# Patient Record
Sex: Male | Born: 1975 | Race: White | Hispanic: No | Marital: Single | Smoking: Current every day smoker
Health system: Southern US, Community
[De-identification: ages and names within clinical notes are randomized; demographics above are authoritative.]

## PROBLEM LIST (undated history)

## (undated) DIAGNOSIS — F1990 Other psychoactive substance use, unspecified, uncomplicated: Secondary | ICD-10-CM

## (undated) DIAGNOSIS — F988 Other specified behavioral and emotional disorders with onset usually occurring in childhood and adolescence: Secondary | ICD-10-CM

## (undated) DIAGNOSIS — F419 Anxiety disorder, unspecified: Secondary | ICD-10-CM

## (undated) DIAGNOSIS — B182 Chronic viral hepatitis C: Secondary | ICD-10-CM

## (undated) DIAGNOSIS — F1110 Opioid abuse, uncomplicated: Secondary | ICD-10-CM

## (undated) DIAGNOSIS — F32A Depression, unspecified: Secondary | ICD-10-CM

## (undated) HISTORY — PX: APPENDECTOMY: SHX54

## (undated) HISTORY — DX: Depression, unspecified: F32.A

## (undated) HISTORY — DX: Chronic viral hepatitis C: B18.2

## (undated) HISTORY — DX: Opioid abuse, uncomplicated: F11.10

## (undated) HISTORY — DX: Anxiety disorder, unspecified: F41.9

## (undated) HISTORY — DX: Other specified behavioral and emotional disorders with onset usually occurring in childhood and adolescence: F98.8

---

## 2003-10-11 ENCOUNTER — Encounter: Admission: RE | Admit: 2003-10-11 | Discharge: 2003-10-11 | Payer: Self-pay | Admitting: *Deleted

## 2004-05-11 ENCOUNTER — Ambulatory Visit (HOSPITAL_COMMUNITY): Payer: Self-pay | Admitting: Psychiatry

## 2004-08-15 ENCOUNTER — Ambulatory Visit (HOSPITAL_COMMUNITY): Payer: Self-pay | Admitting: Psychiatry

## 2004-09-07 ENCOUNTER — Emergency Department (HOSPITAL_COMMUNITY): Admission: EM | Admit: 2004-09-07 | Discharge: 2004-09-07 | Payer: Self-pay | Admitting: Family Medicine

## 2004-09-26 ENCOUNTER — Ambulatory Visit (HOSPITAL_COMMUNITY): Payer: Self-pay | Admitting: Psychiatry

## 2004-11-28 ENCOUNTER — Ambulatory Visit (HOSPITAL_COMMUNITY): Payer: Self-pay | Admitting: Psychiatry

## 2004-11-28 ENCOUNTER — Ambulatory Visit: Payer: Self-pay | Admitting: Psychiatry

## 2006-01-10 ENCOUNTER — Ambulatory Visit (HOSPITAL_COMMUNITY): Payer: Self-pay | Admitting: Psychiatry

## 2006-06-18 ENCOUNTER — Ambulatory Visit (HOSPITAL_COMMUNITY): Payer: Self-pay | Admitting: Psychiatry

## 2006-08-15 ENCOUNTER — Ambulatory Visit (HOSPITAL_COMMUNITY): Payer: Self-pay | Admitting: Psychiatry

## 2006-12-04 ENCOUNTER — Ambulatory Visit (HOSPITAL_COMMUNITY): Payer: Self-pay | Admitting: Psychiatry

## 2010-02-24 ENCOUNTER — Emergency Department (HOSPITAL_COMMUNITY): Admission: EM | Admit: 2010-02-24 | Discharge: 2010-02-25 | Payer: Self-pay | Admitting: Emergency Medicine

## 2010-07-15 ENCOUNTER — Emergency Department (HOSPITAL_COMMUNITY)
Admission: EM | Admit: 2010-07-15 | Discharge: 2010-07-16 | Payer: Self-pay | Source: Home / Self Care | Admitting: Emergency Medicine

## 2010-10-16 LAB — CBC
MCH: 33.9 pg (ref 26.0–34.0)
MCHC: 34.3 g/dL (ref 30.0–36.0)
MCV: 98.9 fL (ref 78.0–100.0)
Platelets: 215 10*3/uL (ref 150–400)
RDW: 14.3 % (ref 11.5–15.5)

## 2010-10-16 LAB — RAPID URINE DRUG SCREEN, HOSP PERFORMED
Barbiturates: NOT DETECTED
Opiates: NOT DETECTED

## 2010-10-16 LAB — DIFFERENTIAL
Basophils Relative: 0 % (ref 0–1)
Eosinophils Absolute: 0 10*3/uL (ref 0.0–0.7)
Eosinophils Relative: 0 % (ref 0–5)
Neutrophils Relative %: 75 % (ref 43–77)

## 2010-10-16 LAB — BASIC METABOLIC PANEL
BUN: 21 mg/dL (ref 6–23)
CO2: 28 mEq/L (ref 19–32)
Calcium: 9 mg/dL (ref 8.4–10.5)
Creatinine, Ser: 1.08 mg/dL (ref 0.4–1.5)
GFR calc Af Amer: >60 mL/min (ref >60)
Glucose, Bld: 99 mg/dL (ref 70–99)

## 2010-10-20 LAB — RAPID URINE DRUG SCREEN, HOSP PERFORMED
Benzodiazepines: NOT DETECTED
Cocaine: NOT DETECTED
Opiates: NOT DETECTED
Tetrahydrocannabinol: NOT DETECTED

## 2010-10-20 LAB — CBC
HCT: 43.4 % (ref 39.0–52.0)
Hemoglobin: 15 g/dL (ref 13.0–17.0)
MCH: 32.8 pg (ref 26.0–34.0)
MCHC: 34.7 g/dL (ref 30.0–36.0)
RDW: 14.3 % (ref 11.5–15.5)

## 2010-10-20 LAB — COMPREHENSIVE METABOLIC PANEL
ALT: 37 U/L (ref 0–53)
CO2: 24 mEq/L (ref 19–32)
Calcium: 9.3 mg/dL (ref 8.4–10.5)
Creatinine, Ser: 0.86 mg/dL (ref 0.4–1.5)
GFR calc Af Amer: >60 mL/min (ref >60)
GFR calc non Af Amer: >60 mL/min (ref >60)
Glucose, Bld: 94 mg/dL (ref 70–99)
Sodium: 144 mEq/L (ref 135–145)
Total Protein: 7.9 g/dL (ref 6.0–8.3)

## 2010-10-20 LAB — DIFFERENTIAL
Lymphocytes Relative: 20 % (ref 12–46)
Lymphs Abs: 1.8 10*3/uL (ref 0.7–4.0)
Monocytes Relative: 6 % (ref 3–12)
Neutrophils Relative %: 73 % (ref 43–77)

## 2010-10-20 LAB — ETHANOL: Alcohol, Ethyl (B): 161 mg/dL — ABNORMAL HIGH (ref 0–10)

## 2011-06-16 ENCOUNTER — Emergency Department (HOSPITAL_COMMUNITY)
Admission: EM | Admit: 2011-06-16 | Discharge: 2011-06-16 | Discharge status: Against Medical Advice | Payer: Self-pay | Attending: Emergency Medicine | Admitting: Emergency Medicine

## 2011-06-16 DIAGNOSIS — Z0389 Encounter for observation for other suspected diseases and conditions ruled out: Secondary | ICD-10-CM | POA: Insufficient documentation

## 2011-06-16 LAB — RAPID URINE DRUG SCREEN, HOSP PERFORMED
Amphetamines: NOT DETECTED
Barbiturates: NOT DETECTED
Benzodiazepines: NOT DETECTED
Cocaine: NOT DETECTED
Opiates: NOT DETECTED
Tetrahydrocannabinol: NOT DETECTED

## 2011-06-16 NOTE — ED Notes (Signed)
Checked on reason for lab results delay; Alycia Rossetti in lab states no blood in lab; Deidre states she walked the blood to the lab; no blood found; Joe AC to lab to speak with Alycia Rossetti; still no blood located; in to speak with patient and mother with Gabriel Rung; as soon as pt notified we needed to restick him the mother stood up and said they were not staying any longer; Gabriel Rung AC attempted to speak with family and patient and patient cursed saying he wasn't staying, mother stated this was ridiculous; pt given bag of belongings and left prior to signing AMA form; Joe attempted to continue to speak with pt and family but they walked out; Kyla Balzarine PA notified of patient leaving

## 2011-06-17 ENCOUNTER — Encounter (HOSPITAL_COMMUNITY): Payer: Self-pay | Admitting: *Deleted

## 2011-06-17 ENCOUNTER — Ambulatory Visit (HOSPITAL_COMMUNITY)
Admission: EM | Admit: 2011-06-17 | Discharge: 2011-06-17 | Disposition: A | Discharge status: Home / Self Care | Payer: PRIVATE HEALTH INSURANCE | Source: Home / Self Care | Attending: Psychiatry | Admitting: Psychiatry

## 2011-06-17 ENCOUNTER — Inpatient Hospital Stay (HOSPITAL_COMMUNITY)
Admission: AD | Admit: 2011-06-17 | Discharge: 2011-06-19 | DRG: 897 | Disposition: A | Discharge status: Home / Self Care | Payer: PRIVATE HEALTH INSURANCE | Source: Ambulatory Visit | Attending: Psychiatry | Admitting: Psychiatry

## 2011-06-17 ENCOUNTER — Emergency Department (HOSPITAL_COMMUNITY)
Admission: EM | Admit: 2011-06-17 | Discharge: 2011-06-17 | Disposition: A | Discharge status: Other Acute Inpatient Hospital | Payer: PRIVATE HEALTH INSURANCE | Source: Home / Self Care

## 2011-06-17 DIAGNOSIS — F1310 Sedative, hypnotic or anxiolytic abuse, uncomplicated: Secondary | ICD-10-CM | POA: Diagnosis present

## 2011-06-17 DIAGNOSIS — F1010 Alcohol abuse, uncomplicated: Principal | ICD-10-CM | POA: Diagnosis present

## 2011-06-17 DIAGNOSIS — F1020 Alcohol dependence, uncomplicated: Secondary | ICD-10-CM

## 2011-06-17 DIAGNOSIS — F1920 Other psychoactive substance dependence, uncomplicated: Secondary | ICD-10-CM | POA: Diagnosis present

## 2011-06-17 DIAGNOSIS — Z79899 Other long term (current) drug therapy: Secondary | ICD-10-CM

## 2011-06-17 DIAGNOSIS — Z560 Unemployment, unspecified: Secondary | ICD-10-CM

## 2011-06-17 LAB — DIFFERENTIAL
Basophils Absolute: 0 10*3/uL (ref 0.0–0.1)
Lymphocytes Relative: 35 % (ref 12–46)
Lymphs Abs: 3.5 10*3/uL (ref 0.7–4.0)
Monocytes Absolute: 0.5 10*3/uL (ref 0.1–1.0)
Neutro Abs: 5.8 10*3/uL (ref 1.7–7.7)

## 2011-06-17 LAB — RAPID URINE DRUG SCREEN, HOSP PERFORMED: Barbiturates: NOT DETECTED

## 2011-06-17 LAB — CBC
HCT: 46.6 % (ref 39.0–52.0)
Hemoglobin: 16.3 g/dL (ref 13.0–17.0)
MCV: 93 fL (ref 78.0–100.0)
RBC: 5.01 MIL/uL (ref 4.22–5.81)
RDW: 14.3 % (ref 11.5–15.5)
WBC: 10 10*3/uL (ref 4.0–10.5)

## 2011-06-17 LAB — BASIC METABOLIC PANEL
BUN: 14 mg/dL (ref 6–23)
CO2: 23 mEq/L (ref 19–32)
Chloride: 100 mEq/L (ref 96–112)
GFR calc Af Amer: >90 mL/min (ref >90)
Potassium: 3.6 mEq/L (ref 3.5–5.1)

## 2011-06-17 MED ORDER — CHLORDIAZEPOXIDE HCL 25 MG PO CAPS
25.0000 mg | ORAL_CAPSULE | Freq: Once | ORAL | Status: AC
  Administered 2011-06-17: 25 mg via ORAL

## 2011-06-17 MED ORDER — LORAZEPAM 1 MG PO TABS: 1.0000 mg | ORAL_TABLET | Freq: Once | ORAL | Status: DC

## 2011-06-17 MED ORDER — IBUPROFEN 200 MG PO TABS: 600.0000 mg | ORAL_TABLET | Freq: Three times a day (TID) | ORAL | Status: DC | PRN

## 2011-06-17 MED ORDER — CITALOPRAM HYDROBROMIDE 10 MG PO TABS: 10.0000 mg | ORAL_TABLET | Freq: Every day | ORAL | Status: DC

## 2011-06-17 MED ORDER — NICOTINE 21 MG/24HR TD PT24: 21.0000 mg | MEDICATED_PATCH | Freq: Every day | TRANSDERMAL | Status: DC

## 2011-06-17 MED ORDER — VENLAFAXINE HCL ER 37.5 MG PO CP24
37.5000 mg | ORAL_CAPSULE | Freq: Every day | ORAL | Status: DC
  Administered 2011-06-18 – 2011-06-19 (×2): 37.5 mg via ORAL
  Filled 2011-06-17 (×2): qty 1

## 2011-06-17 MED ORDER — CHLORPROMAZINE HCL 25 MG PO TABS
25.0000 mg | ORAL_TABLET | Freq: Three times a day (TID) | ORAL | Status: DC
  Administered 2011-06-17 – 2011-06-19 (×6): 25 mg via ORAL
  Filled 2011-06-17 (×7): qty 1

## 2011-06-17 MED ORDER — CHLORDIAZEPOXIDE HCL 25 MG PO CAPS: 25.0000 mg | ORAL_CAPSULE | ORAL | Status: DC

## 2011-06-17 MED ORDER — LORAZEPAM 1 MG PO TABS: 1.0000 mg | ORAL_TABLET | Freq: Three times a day (TID) | ORAL | Status: DC | PRN

## 2011-06-17 MED ORDER — HYDROXYZINE PAMOATE 25 MG PO CAPS: 25.0000 mg | ORAL_CAPSULE | Freq: Four times a day (QID) | ORAL | Status: DC | PRN

## 2011-06-17 MED ORDER — CHLORDIAZEPOXIDE HCL 25 MG PO CAPS: 25.0000 mg | ORAL_CAPSULE | Freq: Three times a day (TID) | ORAL | Status: DC

## 2011-06-17 MED ORDER — ACETAMINOPHEN 325 MG PO TABS: 650.0000 mg | ORAL_TABLET | ORAL | Status: DC | PRN

## 2011-06-17 MED ORDER — LOPERAMIDE HCL 2 MG PO CAPS: 2.0000 mg | ORAL_CAPSULE | ORAL | Status: DC | PRN

## 2011-06-17 MED ORDER — THERA M PLUS PO TABS
1.0000 | ORAL_TABLET | Freq: Every day | ORAL | Status: DC
  Administered 2011-06-17: 1 via ORAL
  Administered 2011-06-18: 08:00:00 via ORAL
  Administered 2011-06-19: 1 via ORAL
  Filled 2011-06-17 (×2): qty 1

## 2011-06-17 MED ORDER — THERA M PLUS PO TABS
1.0000 | ORAL_TABLET | Freq: Every day | ORAL | Status: DC
  Administered 2011-06-17: 1 via ORAL

## 2011-06-17 MED ORDER — CHLORDIAZEPOXIDE HCL 25 MG PO CAPS
25.0000 mg | ORAL_CAPSULE | Freq: Four times a day (QID) | ORAL | Status: DC
  Administered 2011-06-17: 25 mg via ORAL
  Filled 2011-06-17: qty 1

## 2011-06-17 MED ORDER — ONDANSETRON 4 MG PO TBDP: 4.0000 mg | ORAL_TABLET | Freq: Four times a day (QID) | ORAL | Status: DC | PRN

## 2011-06-17 MED ORDER — CHLORDIAZEPOXIDE HCL 25 MG PO CAPS: 25.0000 mg | ORAL_CAPSULE | Freq: Every day | ORAL | Status: DC

## 2011-06-17 MED ORDER — ZOLPIDEM TARTRATE 5 MG PO TABS: 5.0000 mg | ORAL_TABLET | Freq: Every evening | ORAL | Status: DC | PRN

## 2011-06-17 MED ORDER — CHLORDIAZEPOXIDE HCL 25 MG PO CAPS
ORAL_CAPSULE | ORAL | Status: AC
  Filled 2011-06-17: qty 1

## 2011-06-17 MED ORDER — VITAMIN B-1 100 MG PO TABS
100.0000 mg | ORAL_TABLET | Freq: Every day | ORAL | Status: DC
  Administered 2011-06-18 – 2011-06-19 (×2): 100 mg via ORAL
  Filled 2011-06-17 (×3): qty 1

## 2011-06-17 MED ORDER — NICOTINE 21 MG/24HR TD PT24
21.0000 mg | MEDICATED_PATCH | Freq: Every day | TRANSDERMAL | Status: DC
  Administered 2011-06-17 – 2011-06-18 (×2): 21 mg via TRANSDERMAL
  Filled 2011-06-17 (×3): qty 1

## 2011-06-17 MED ORDER — CHLORDIAZEPOXIDE HCL 25 MG PO CAPS: 25.0000 mg | ORAL_CAPSULE | Freq: Four times a day (QID) | ORAL | Status: DC | PRN

## 2011-06-17 MED ORDER — THIAMINE HCL 100 MG/ML IJ SOLN
100.0000 mg | Freq: Once | INTRAMUSCULAR | Status: AC
  Administered 2011-06-17: 14:00:00 via INTRAMUSCULAR

## 2011-06-17 MED ORDER — CHLORDIAZEPOXIDE HCL 25 MG PO CAPS
ORAL_CAPSULE | ORAL | Status: AC
  Administered 2011-06-17: 14:00:00
  Filled 2011-06-17: qty 1

## 2011-06-17 MED ORDER — ONDANSETRON HCL 4 MG PO TABS: 4.0000 mg | ORAL_TABLET | Freq: Three times a day (TID) | ORAL | Status: DC | PRN

## 2011-06-17 MED ORDER — CHLORDIAZEPOXIDE HCL 25 MG PO CAPS: 25.0000 mg | ORAL_CAPSULE | Freq: Four times a day (QID) | ORAL | Status: DC

## 2011-06-17 NOTE — Progress Notes (Signed)
Interdisciplinary Treatment Plan Update (Adult)  Date:  06/17/2011 Time Reviewed:  4:58 PM  Progress in Treatment: Attending groups: Yes. Participating in groups:  Yes. Taking medication as prescribed:  Yes. Tolerating medication:  Yes. Family/Significant othe contact made:  Yes, individual(s) contacted:  mother Patient understands diagnosis:  Yes. and As evidenced by:  discussing his trouble with substance abuse, and relationship issues that  baffle him Discussing patient identified problems/goals with staff:  Yes. and As evidenced by:  talking about need to identify the best sobriety plan for self, and letting go of relationship with lt girlfriend Medical problems stabilized or resolved:  Yes. Denies suicidal/homicidal ideation: Yes. and As evidenced by:  self inventory Issues/concerns per patient self-inventory:  Yes. and As evidenced by:  hopelessness is 6, depression is 8, positive for agitation Other:  New problem(s) identified: No, Describe:  none identified  Reason for Continuation of Hospitalization: Depression Medication stabilization Withdrawal symptoms  Interventions implemented related to continuation of hospitalization:  Librium detox protocol  Assess for psychotropics  Encourage group participation  Additional comments:Effexor started today for OCD, depression  Thorazine started for agitation  Estimated length of stay: 3-4 days  Discharge Plan: unknown Pt to interview with [facility] today  New goal(s):  Review of initial/current patient goals per problem list:    1.  Goal(s):Safely detox from alcohol  Met:  No  Target date:11/15 As evidenced NG:EXBMWU CIWA score from current to 0 2.  Goal (s):Work with pt to identify comprehensive sobriety plan, and make appropriate referrals  Met:  No  Target date:11/14  As evidenced by: finalization by day of d/c  3.  Goal(s):  Met:  No  Target date:  As evidenced by:  4.  Goal(s):  Met:  No  Target  date:  As evidenced by:  Attendees: Patient:  Kevin Parker 11/12/20124:58 PM  Family:   11/12/20124:58 PM  Physician:  Orson Aloe 11/12/20124:58 PM  Nursing:   Izola Price 11/12/20124:58 PM  Case Manager:  Richelle Ito 11/12/20124:58 PM  Counselor:  Vanetta Mulders 11/12/20124:58 PM  Other:   11/12/20124:58 PM  Other:   11/12/20124:58 PM  Other:   11/12/20124:58 PM  Other:   11/12/20124:58 PM   Scribe for Treatment Team:   Ida Rogue, 06/17/2011, 4:58 PM

## 2011-06-17 NOTE — BH Assessment (Signed)
Assessment Note   Kevin Parker is an 35 y.o. male.  AXIS I: ALCOHOL ABUSE Axis II: Deferred Axis III: No past medical history on file. Axis IV: other psychosocial or environmental problems Axis V: 51-60 moderate symptoms  Past Medical History: No past medical history on file.  No past surgical history on file.  Family History: No family history on file.  Social History:  reports that he has been smoking.  He does not have any smokeless tobacco history on file. He reports that he drinks alcohol. He reports that he uses illicit drugs (Marijuana).  Allergies: No Known Allergies  Home Medications:  Medications Prior to Admission  Medication Dose Route Frequency Provider Last Rate Last Dose  . acetaminophen (TYLENOL) tablet 650 mg  650 mg Oral Q4H PRN Tatyana A Kirichenko, PA      . ibuprofen (ADVIL,MOTRIN) tablet 600 mg  600 mg Oral Q8H PRN Tatyana A Kirichenko, PA      . LORazepam (ATIVAN) tablet 1 mg  1 mg Oral Q8H PRN Tatyana A Kirichenko, PA      . LORazepam (ATIVAN) tablet 1 mg  1 mg Oral Once Tatyana A Kirichenko, PA      . nicotine (NICODERM CQ - dosed in mg/24 hours) patch 21 mg  21 mg Transdermal Daily Tatyana A Kirichenko, PA      . ondansetron (ZOFRAN) tablet 4 mg  4 mg Oral Q8H PRN Tatyana A Kirichenko, PA      . zolpidem (AMBIEN) tablet 5 mg  5 mg Oral QHS PRN Lottie Mussel, PA       No current outpatient prescriptions on file as of 06/17/2011.    OB/GYN Status:  No LMP for male patient.   PT PRESENTS REQUESTING DETOX FROM ETOH. PT STATES HE WOULD DRINK A 12PK 1-2 X WEEKLY AS WELL AS SMOKES THC. PT STATES LIFE IS SPAROWLING OUT OF CONTROL & WANT TO GET HELP BEFORE HIS DRINKING GETS OUT OF HAND. PT DENIES HI OR AV. PT DENIES CURRENT WITHDRAWAL SYMPTOMS. PT ADMITS TO PRIOR ADMISSION AT [facility] & WANTS TO BE REFERRED THERE FOR ADMISSION. PT IS ABLE TO CONTRACT FOR SAFETY. PT IS PENDING DISPOSITION AT [facility] FOR 300 HALL BED.                                          Disposition:     On Site Evaluation by:   Reviewed with Physician:     Waldron Session 06/17/2011 2:29 AM

## 2011-06-17 NOTE — ED Provider Notes (Signed)
Medical screening examination/treatment/procedure(s) were performed by non-physician practitioner and as supervising physician I was immediately available for consultation/collaboration.  The patient has been accepted at the behavioral health hospital  Flint Melter, MD 06/17/11 [phone]

## 2011-06-17 NOTE — ED Provider Notes (Signed)
History     CSN: [id number] Arrival date & time: 06/17/2011 12:19 AM   First MD Initiated Contact with Patient 06/17/11 0031      Chief Complaint  Patient presents with  . Alcohol Problem    (Consider location/radiation/quality/duration/timing/severity/associated sxs/prior treatment) Patient is a 35 y.o. male presenting with alcohol problem. The history is provided by the patient.  Alcohol Problem This is a chronic problem. The current episode started more than 1 month ago. The problem occurs daily. Pertinent negatives include no abdominal pain, anorexia, chest pain, myalgias, nausea or vomiting. The symptoms are aggravated by nothing.  Pt reports alcohol problem and taking benzos daily. Asking for detox. Reports that he has relapsed about 10 mon ago after being clean for several months. Pt denies any other complaints. Denies SI or HI. States he just wants to get his life back together.   History reviewed. No pertinent past medical history.  History reviewed. No pertinent past surgical history.  History reviewed. No pertinent family history.  History  Substance Use Topics  . Smoking status: Current Everyday Smoker -- 1.0 packs/day  . Smokeless tobacco: Not on file  . Alcohol Use: Yes     beer 12 pack per day; liquor pint/day      Review of Systems  Constitutional: Negative.   HENT: Negative.   Eyes: Negative.   Respiratory: Negative.   Cardiovascular: Negative for chest pain.  Gastrointestinal: Negative.  Negative for nausea, vomiting, abdominal pain and anorexia.  Genitourinary: Negative.   Musculoskeletal: Negative.  Negative for myalgias.  Psychiatric/Behavioral:       Depression    Allergies  Review of patient's allergies indicates no known allergies.  Home Medications  No current outpatient prescriptions on file.  BP 125/73  Pulse 93  Temp(Src) 97.8 F (36.6 C) (Oral)  Resp 20  SpO2 95%  Physical Exam  Nursing note and vitals  reviewed. Constitutional: He is oriented to person, place, and time. He appears well-developed and well-nourished.       Appears intoxicated  HENT:  Head: Normocephalic and atraumatic.  Eyes: Conjunctivae and EOM are normal. Pupils are equal, round, and reactive to light.  Neck: Neck supple.  Cardiovascular: Normal rate and regular rhythm.   Pulmonary/Chest: Effort normal and breath sounds normal. No respiratory distress. He has no wheezes.  Abdominal: Soft. Bowel sounds are normal. There is no tenderness.  Musculoskeletal: Normal range of motion.  Neurological: He is alert and oriented to person, place, and time. No cranial nerve deficit. Coordination normal.  Skin: Skin is warm and dry.  Psychiatric: His behavior is normal. Judgment and thought content normal.       depression    ED Course  Procedures (including critical care time)  Pt denies Si or HI. Wants detox from alcohol and opiates. Will call [facility]  1:44 AM Spoke with [facility]. Will assess for inpatient treatment.  MDM          Lottie Mussel, PA 06/17/11 [phone]

## 2011-06-17 NOTE — Progress Notes (Signed)
Pt in dayroom, watching TV, but not interacting with peers. Appears flat and depressed. Cooperative with assessment. A/Ox4. No acute distress noted. States he has had a good day. When asked to qualify, states he now feels more hopeful that he has a good D/C plan. States he discussed options with his mother and they feel like he would benefit from going to [facility] for a 28 day program. States he feels like this is his best option and appears to eager to move on to the next stage of treatment. Support and encouragement provided. Otherwise no questions or concerns expressed. Denies SI/HI/AVH and verbally contracts for safety. Denies pain or discomfort. POC and medications for the shift reviewed and understanding verbalized. Safety has been maintained with Q41minute observation. Will cont current POC.

## 2011-06-17 NOTE — Progress Notes (Signed)
[facility] Group Notes:  (Counselor/Nursing/MHT/Case Management/Adjunct)  06/17/2011 5:12 PM  Type of Therapy:  Group Therapy  Participation Level:  Active  Participation Quality:  Appropriate  Affect:  Depressed  Cognitive:  Oriented  Insight:  Limited  Engagement in Group:  Good  Engagement in Therapy:  Limited  Modes of Intervention:  Problem-solving, Role-play and Support  Summary of Progress/Problems: Pt was able to share how he came to the hospital late last night due to a relapse and the discovery of his "coomon law" wife of 12 years having an affair. Pt also shared he had received treatment in the past from [facility] and has three children he cares deeply for 4,7, and 9. Pt stated he is going to another treatment on Friday.   Purcell Nails 06/17/2011, 5:12 PM

## 2011-06-17 NOTE — ED Provider Notes (Signed)
Medical screening examination/treatment/procedure(s) were performed by non-physician practitioner and as supervising physician I was immediately available for consultation/collaboration.  Flint Melter, MD 06/17/11 [phone]

## 2011-06-17 NOTE — Progress Notes (Addendum)
  Patient's first admission to [facility].  Smokes one pack cigarettes daily.  Drinks one six pack beer daily, and 1-2 drinks liquor daily.  Takes Xanax 1-2 daily.  Xanax had been prescribed to him in the past, now he gets his xanax from his dad who has a prescription.   Previously was patient of Dr. Lolly Mustache.  No income, no disability.  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  Denied medical problems.  Stated he has panic attacks, anxiety, substance abuse problem.  Denied SI and HI.  Denied A/V hallucinations.  Denied pain.  Stated he needs nicotine patch.   Mother is a Engineer, civil (consulting) at [employer].   Has been very cooperative and pleasant.   Ate breakfast in dining room.  Stated he needs to sleep which helps him with his addiction.  Stated he wants to sleep/rest which helps him with his addictions.

## 2011-06-17 NOTE — ED Notes (Signed)
Pt requesting help with detox from etoh and xanax; left previously tonight due to wait; denies si/hi

## 2011-06-17 NOTE — Progress Notes (Signed)
Early this afternoon, pt's mother tracked me down and laid out her plan for pt.  It involves going to [facility] for their 28 day program, and she will pick him up on Friday to take him.  She bristled at the suggestion that he may have other plans than [facility], and that another day besides Friday may be his d/c day.  She is obviously very concerned about his well-being, and wants to be supportive.  She made it clear that staying with her is not an option at d/c.  Talked to pt after  my visit with mother.  While he is open to the idea of rehab, he is also focused on court and finding a stable place to live.  We talked about going from here to a half-way house vs rehab, and the pros and cons of both.  Pt states he will continue to discuss with mother, and decide on plan in next day or 2.

## 2011-06-17 NOTE — Progress Notes (Addendum)
   Patient's first admission to [facility].  Smokes one pack cigarettes daily.  Drinks one six pack beer daily, and 1-2 drinks liquor daily.  Takes Xanax 1-2 daily.  Xanax had been prescribed to him in the past, now he gets his xanax from his dad who has a prescription.   Previously was patient of Dr. Lolly Mustache.  No income, no disability.  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  Denied medical problems.  Stated he has panic attacks, anxiety, substance abuse problem.  Denied SI and HI.  Denied A/V hallucinations.  Denied pain.  Stated he needs nicotine patch.   Mother is a Engineer, civil (consulting) at [employer].   Has been very cooperative and pleasant.   Ate breakfast in dining room.  Stated he needs to sleep which helps him with his addiction.  Stated he wants to sleep/rest which helps him with his addictions.   Patient stated he has DUI charge pending, court date 07/12/2011.   Patient has 3 children.  Presently living with his fiancee.   Not sure where he will live after [facility] discharge.

## 2011-06-17 NOTE — Progress Notes (Signed)
35 yo Caucasian male who presented to the ED for alcohol detoxification.  Labs were completed prior to pt transferring to the [facility].  This is a voluntary admission to the services of Dr Dan Humphreys.    History of Present Illness:  This is the second alcohol detox for Kevin Parker who has a history of anxiety, panic attacks, and alcohol dependency.  He completed a program at [facility] and had 9 months of sobriety which ended in September when his fiance left him for another man.  He has been drinking at least a 6 pack of beer daily with shots of liquor.  Kevin Parker has also been taking unprescribed xanax during this time.  His last drink was yesterday when he drank a 24 pack of beer with xanax.  His primary stressors are relationship issues, unemployment, severe financial issues, no driver's license, and legal issues related to 2 DWIs from 2011.  He is less likely to drink when he is busy and distracted.  Clemens currently does not take any meds but reports taking celexa until the end of February and would like to start another antidepressant.  He does report weight gain while on the celexa and would like to try another antidepressant.  The patient reports no recent changes in appetite or weight.  He does use alcohol and xanax to facilitate sleep.  Gawain denies suicidal ideation and has no prior attempts.  He reports no access to firearms or weapons.  Vang also denies HI and AVH--and no S/S.  He does not have S/S of bipolar, psychosis, dissociation, or PTSD.  The patient does state he cannot stop thinking about his ex-gf.  He is presently living with his mother in [place] who brought him to the ED last night.  His ex-gf and children live in [place].  Alcohol/Drug History:  Darvell started drinking at the age of 60 and typically just on weekends.  It became a daily routing when he was 35 yo.  He is a 1.5 ppd smoker since the age of 89.  He does report seeing Dr Lolly Mustache and was prescribed xanax for  anxiety.  The patient started abusing the xanax in September.  Medical History:  The patient denies any medical issues.  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  He has no allergies and no physical complaints at this time.  MSE:  Kevin Parker is a single, white WN/WDM who is appropriately dressed in casual clothing.  He appears his stated age of 27 and is cooperative on assessment.  His eye contact is minimal but engages easily with the examiner.  His speech is fluent with normal tone.  His affect is congruent with his expressed mood of depression.  His movements are smooth and without abnormality, no hand tremors.  Kevin Parker is alert, fully oriented, and denies any problems with memory at this time.  He does report difficulty concentrating and focusing.  His thought processes are coherent and logical.  Kevin Parker has no evidence of thought disorganization or delusions.  He appears of average intelligence with problem solving abilities.  He admits to poor judgment with his drinking and xanax consumption.  The patient admits to excessively thinking about his ex-gf.  Review of Systems: General:  Denies changes in weight, night sweats, chills, fatigue. Skin:  Denies any rashes, lesions. HEENT:  Denies eye or ear pain, changes in vision or hearing.  Denies sore throat, neck stiffness or tenderness, headaches.  CV:  Denies palpitations, chest pain. Resp:  Denies dsyuria,  frequency, hematuria, incontinence. MSK:  Denies weakness, arthralgias, myalgias. Neuro:  Denies loss of sensation, confusion, numbness, dizziness. Heme:  Denies easy bruising or bleeding. Endocrine:  Denis polydipsia, polyuria, polyphagia, heat/cold sensitivity. Psych:  Anxiety, depression.  Denies SI, HI, AVH.  Physical Examination: VS:  T  97.3; P 90; RR 18; BP 115/80; Ht  6'; Wt 87.544 kg; BMI 26.2.  General  Well developed, well nourished male, who appears his stated age of 71.  Skin/Hair/Nails Warm, dry skin.   Nails--pink, hard, w/ brisk capillary refill, no clubbing or cyanosis.  Hair--thick, straight, brown, evenly distributed in male pattern.  Head   Normocephalic/atraumatic.  Eyes   Eyebrows & eyelashes evenly distributed, symmetrical.  Sclera white, conjunctiva pink.  PERRLA, no hemorrhages or exudate.  Ears   Positioned at level of outer canthus of eyes.  Pinnae flexible w/o lesions or tenderness.  Responds to normal conversational voice.  Nose   Non-symmetrical, no tenderness, deviated septum noted.  Oropharynx  Lips symmetrical, pink, moist, w/o lesions.  Oral mucosa pink, moist, w/o lesions.  Teeth in goo repair.  Thorax/Lungs Symmetrical w/ good expansion; AP < transverse  (2:1) diameter; no masses/tenderness.  Breath sounds vesicular bilaterally w/o adventitious sounds; respirations even and     non-labored, no use of accessory muscles.  Heart   RRR, no heaves or thrusts.  Crisp S1/S2;  PMI at [facility] ICS 2 cm; no murmurs, rubs, gallops, bruits.  Radial pulses equal bilaterally.  Abdomen   Symmetrical, soft, non-tender, and non-distended abdomen with no masses, no organomegaly, active BS x 4, no bruits.  Neuro   A & O x 3.  Cranial nerves:  II-XII grossly intact.  5/5 muscle strength in all extremities bilaterally; no atrophy.  No abnormalities of gait.  MSK   FROM; no joint enlargement, deformity, tenderness, swelling, erythema.  Normal curvature of the spine.  Labs reviewed and WDL; positive for alcohol and benzodiazepines on UDS  Axis I:  Alcohol dependency, anxiety, depression  Axis II: None noted. Axis ION:GEXBMW Axis UX:[id number] issues, severe financial issues, unemployed, legal issues (DWIs) Axis V: GAF=50 on admission  Treatment Plan: 1-Librium protocol for alcohol detox 2-Begin an antidepressant 3-Nicotine patch (21 mg) ordered

## 2011-06-17 NOTE — Progress Notes (Addendum)
Suicide Risk Assessment  Admission Assessment     Demographic factors:  Assessment Details Time of Assessment:  (denied si and HI, contracted for safet;y) Current Mental Status:  Current Mental Status:  (denied si and HI, contracted for safety) Loss Factors:  Loss Factors: Decrease in vocational status;Financial problems / change in socioeconomic status Historical Factors:  Historical Factors: Impulsivity Risk Reduction Factors:  Risk Reduction Factors:  (unemployed, no income, alcohol & xanax addiction)  CLINICAL FACTORS:   Severe Anxiety and/or Agitation Depression:   Comorbid alcohol abuse/dependence Alcohol/Substance Abuse/Dependencies Previous Psychiatric Diagnoses and Treatments  COGNITIVE FEATURES THAT CONTRIBUTE TO RISK:  No cognitive risk factors noted    SUICIDE RISK:   Minimal: No identifiable suicidal ideation.  Patients presenting with no risk factors but with morbid ruminations; may be classified as minimal risk based on the severity of the depressive symptoms  Patient denies suicidal or homicidal ideation, hallucinations, illusions, or delusions. Patient engages with good eye contact, is able to focus adequately in a one to one setting, and has clear goal directed thoughts. Patient speaks with a natural conversational volume, rate, and tone. Anxiety was reported at 9.5 on a scale of 1 the least and 10 the most. Depression was reported also at 12! on the same scale. Patient is oriented times 4, recent and remote memory intact. Judgement: able to see that his substance use is not the solution to his stressor Insight: sees the lethality of his addiction Plan: try Thorazine for anxiety, restart Celexa for depression, medically monitor detox, and refer to [facility] ELOS: 3 to 5 days.   Aero Drummonds 06/17/2011, 7:02 PM

## 2011-06-17 NOTE — Progress Notes (Signed)
Note reviewed and agree with findings. This is Landry Corporal, NP and this is a psychiatric admission assessment

## 2011-06-17 NOTE — Progress Notes (Signed)
[facility] Group Notes:  (Counselor/Nursing/MHT/Case Management/Adjunct)  06/17/2011 3:10 PM  Type of Therapy:  Group Therapy  Participation Level:  Did Not Attend   Purcell Nails 06/17/2011, 3:10 PM

## 2011-06-18 DIAGNOSIS — F1920 Other psychoactive substance dependence, uncomplicated: Secondary | ICD-10-CM | POA: Diagnosis present

## 2011-06-18 MED ORDER — NICOTINE 21 MG/24HR TD PT24: 21.0000 | MEDICATED_PATCH | Freq: Every day | TRANSDERMAL | Status: AC

## 2011-06-18 MED ORDER — CHLORPROMAZINE HCL 25 MG PO TABS: 25.0000 mg | ORAL_TABLET | Freq: Three times a day (TID) | ORAL | Status: AC

## 2011-06-18 MED ORDER — VENLAFAXINE HCL ER 37.5 MG PO CP24: 37.5000 mg | ORAL_CAPSULE | Freq: Every day | ORAL | Status: DC

## 2011-06-18 NOTE — Progress Notes (Signed)
Patient out of room and in the milieu most of the day.  Attending and participating in groups.  Patient called [facility] today and has been accepted there for long term treatment.  He will be here for an additional 3 or 4 days.  Has been pleasant and cooperative with staff and peers.  CIWA assessment negative at noon.

## 2011-06-18 NOTE — Progress Notes (Signed)
Suicide Risk Assessment  Discharge Assessment     Demographic factors:  Assessment Details Time of Assessment:  (denied si and HI, contracted for safet;y) Current Mental Status:  Current Mental Status:  (denied si and HI, contracted for safety) Risk Reduction Factors:  Risk Reduction Factors:  (unemployed, no income, alcohol & xanax addiction)  CLINICAL FACTORS:   Depression:   Comorbid alcohol abuse/dependence Alcohol/Substance Abuse/Dependencies Previous Psychiatric Diagnoses and Treatments  COGNITIVE FEATURES THAT CONTRIBUTE TO RISK:  No cognitive risk factors noted    SUICIDE RISK:   Minimal: No identifiable suicidal ideation.  Patients presenting with no risk factors but with morbid ruminations; may be classified as minimal risk based on the severity of the depressive symptoms  Patient denies suicidal or homicidal ideation, hallucinations, illusions, or delusions. Patient engages with good eye contact, is able to focus adequately in a one to one setting, and has clear goal directed thoughts. Patient speaks with a natural conversational volume, rate, and tone. Anxiety was reported at 4 on a scale of 1 the least and 10 the most. Depression was reported at 8 on the same scale. Patient is oriented times 4, recent and remote memory intact. Judgement: Improved from admission Insight: Improved from admission Plan: [facility] on Nov 28th or possibly [facility] [place], [place] 06/18/2011, 5:13 PM

## 2011-06-18 NOTE — Discharge Summary (Signed)
Physician Discharge Summary  Patient ID: Kevin Parker MRN: [id number] DOB/AGE: 08-16-1975 35 y.o.  Admit date: 06/17/2011 Discharge date: 06/18/2011  Discharge Diagnoses:  Principal Problem:  *Substance addiction  Axis I #1 alcohol abuse Axis II deferred Axis III no diagnosis Axis IV moderate Axis V 55  Discharged Condition: Good  Hospital course: Patient was admitted placed on Librium detox for alcohol he did well on that. No difficulties.  He cooperated with case management and was accepting of referral to [facility] and had also arrange something with [facility] with his family prior to coming in. It is unclear whether he would be going to [facility] or [facility] the time of this dictation   Discharge Exam: Blood pressure 134/82, pulse 91, temperature 98.3 F (36.8 C), temperature source Oral, resp. rate 18, height 6' (1.829 m), weight 87.544 kg (193 lb), SpO2 97.00%.  Patient denies suicidal or homicidal ideation, hallucinations, illusions, or delusions. Patient engages with good eye contact, is able to focus adequately in a one to one setting, and has clear goal directed thoughts. Patient speaks with a natural conversational volume, rate, and tone. Anxiety was reported at 4 on a scale of 1 the least and 10 the most. Depression was reported at 8 on the same scale. Patient is oriented times 4, recent and remote memory intact. Judgement: Improved from admission Insight: Improved from admission Plan: [facility] on Nov 28th or possibly [facility]   Disposition: Residential tx  Current Discharge Medication List    START taking these medications   Details  chlorproMAZINE (THORAZINE) 25 MG tablet Take 1 tablet (25 mg total) by mouth 3 (three) times daily. Qty: 90 tablet, Refills: 0    nicotine (NICODERM CQ - DOSED IN MG/24 HOURS) 21 mg/24hr patch Place 21 patches onto the skin daily. Qty: 21 patch, Refills: 0    venlafaxine (EFFEXOR-XR) 37.5 MG 24 hr capsule Take 1  capsule (37.5 mg total) by mouth daily with breakfast. Qty: 30 capsule, Refills: 0      CONTINUE these medications which have NOT CHANGED   Details  Multiple Vitamins-Minerals (MULTIVITAMINS THER. W/MINERALS) TABS Take 1 tablet by mouth daily.           SignedDan Humphreys, Tomeika Weinmann 06/18/2011, 5:18 PM

## 2011-06-18 NOTE — Discharge Summary (Addendum)
Discharge Note  Date of Admission:  06/17/2011  Date of Discharge:  06/19/2011   Is patient on multiple antipsychotic therapies at discharge:  No      Patient phone:  [phone] (home)  Patient address:   [address] [place] [address],    Follow-up recommendations:    Comments:    The patient received suicide prevention pamphlet:  No Belongings returned:  Judene Companion, Temperence Zenor 06/18/2011, 5:17 PM

## 2011-06-18 NOTE — Progress Notes (Signed)
[facility] Group Notes:  (Counselor/Nursing/MHT/Case Management/Adjunct)  06/18/2011 3:03 PM  Type of Therapy:  Group Therapy  Participation Level:  Minimal  Participation Quality:  Appropriate and Attentive  Affect:  Flat  Cognitive:  Oriented  Insight:  None  Engagement in Group:  Limited  Engagement in Therapy:  Limited  Modes of Intervention:  Problem-solving, Support and exploration  Summary of Progress/Problems: Pt quiet but attentive in group.   Purcell Nails 06/18/2011, 3:03 PM

## 2011-06-18 NOTE — Progress Notes (Signed)
Recreation Therapy Group Note  Date: 06/18/2011         Time: 1000       Group Topic/Focus: Patient invited to participate in animal assisted therapy. Pets as a coping skill and responsibility were discussed.   Participation Level: Active  Participation Quality: Appropriate and Attentive  Affect: Appropriate  Cognitive: Appropriate and Oriented   Additional Comments: Patient spoke about training his Kevin Parker.

## 2011-06-18 NOTE — Progress Notes (Signed)
In dayroom, watching tv with peers. Appears flat, mood appropriate. Calm and cooperative with assessment. Open and spontaneous in conversation. A/Ox4. No acute distress noted. States he now does not think he will be able to go to [facility] as his father refuses to pay for it. States he doesn't know what he will do now. Support and encouragement provided. Encouraged to discuss with CM in AM and see what options he had. Appeared to be ambivalent toward his situation at this point.  Otherwise no questions or concerns. Denies pain. Denies SI/HI/AVH and contracts for safety. POC and medications for the shift reviewed and understanding verbalized. Safety has been maintained with Q78minute observatrion. Will continue current POC.

## 2011-06-18 NOTE — Progress Notes (Signed)
[facility] Group Notes:  (Counselor/Nursing/MHT/Case Management/Adjunct)  06/18/2011 5:15 PM  Type of Therapy:  Group therapy  Participation Level:  Minimal  Participation Quality:  Attentive  Affect:  Appropriate  Cognitive:  Appropriate  Insight:  Limited  Engagement in Group:  Limited  Engagement in Therapy:  Limited  Modes of Intervention:  Problem-solving, Support and exploration  Summary of Progress/Problems: Pt attentive but quiet in group, pt related to I am your addiction letter and shared about a video he once saw that was similar about a lion. Pt supportive of others.   Purcell Nails 06/18/2011, 5:15 PM

## 2011-06-19 NOTE — Progress Notes (Signed)
Pt.awaiting Discharge today.Is med compliant and participating in Groups.He denies S/I,H/I  And A/V hallucinations.He Rates his Depression and Hopelessness as 6/10.He wanted to go to another rehab but his father would not pay for it. Encouraged and supported.

## 2011-06-19 NOTE — Progress Notes (Signed)
[facility] Family/Significant Other Suicide Prevention Education  Suicide Prevention Education:  Patient Refusal for Family/Significant Other Suicide Prevention Education: While we did have a signed release to contact pt's Mother, Kevin Parker, the patient Kevin Parker has refused to provide written consent for family/significant other to be provided Family/Significant Other Suicide Prevention Education during admission and/or prior to discharge.  Writer provided suicide prevention education to patient as PRN counselor had requested suicide prevention edu be provided to pt's mother. The patient states that he currently has no suicidal ideation and also had no suicidal ideation at admit and has no prior attempts. Mobile crisis management business card placed in chart and will be provided to patient at discharge.  [name] 06/19/2011, 1:24 PM

## 2011-06-20 NOTE — Progress Notes (Signed)
Patient Discharge Instructions:  Dictated admission note faxed, Date faxed:  06/20/2011 D/C instructions faxed, Date faxed:  06/20/2011 D/C Summary faxed, Date faxed:  06/20/2011 Med. Rec. Form faxed, Date faxed:  06/20/2011  Wandra Scot, 06/20/2011, 3:55 PM

## 2015-11-15 ENCOUNTER — Emergency Department (HOSPITAL_COMMUNITY)
Admission: EM | Admit: 2015-11-15 | Discharge: 2015-11-16 | Disposition: A | Discharge status: Home / Self Care | Payer: PRIVATE HEALTH INSURANCE | Attending: Emergency Medicine | Admitting: Emergency Medicine

## 2015-11-15 ENCOUNTER — Encounter (HOSPITAL_COMMUNITY): Payer: Self-pay | Admitting: Emergency Medicine

## 2015-11-15 DIAGNOSIS — L03113 Cellulitis of right upper limb: Secondary | ICD-10-CM | POA: Insufficient documentation

## 2015-11-15 DIAGNOSIS — F17200 Nicotine dependence, unspecified, uncomplicated: Secondary | ICD-10-CM | POA: Insufficient documentation

## 2015-11-15 DIAGNOSIS — F1110 Opioid abuse, uncomplicated: Secondary | ICD-10-CM | POA: Insufficient documentation

## 2015-11-15 DIAGNOSIS — R51 Headache: Secondary | ICD-10-CM | POA: Insufficient documentation

## 2015-11-15 LAB — CBC WITH DIFFERENTIAL/PLATELET
Basophils Absolute: 0 10*3/uL (ref 0.0–0.1)
Basophils Relative: 0 %
EOS ABS: 0.1 10*3/uL (ref 0.0–0.7)
Eosinophils Relative: 1 %
HEMATOCRIT: 41.1 % (ref 39.0–52.0)
HEMOGLOBIN: 14.3 g/dL (ref 13.0–17.0)
LYMPHS ABS: 2.8 10*3/uL (ref 0.7–4.0)
LYMPHS PCT: 30 %
MCH: 32.5 pg (ref 26.0–34.0)
MCHC: 34.8 g/dL (ref 30.0–36.0)
MCV: 93.4 fL (ref 78.0–100.0)
Monocytes Absolute: 0.8 10*3/uL (ref 0.1–1.0)
Monocytes Relative: 9 %
NEUTROS ABS: 5.7 10*3/uL (ref 1.7–7.7)
NEUTROS PCT: 60 %
Platelets: 245 10*3/uL (ref 150–400)
RBC: 4.4 MIL/uL (ref 4.22–5.81)
RDW: 14.3 % (ref 11.5–15.5)
WBC: 9.5 10*3/uL (ref 4.0–10.5)

## 2015-11-15 LAB — COMPREHENSIVE METABOLIC PANEL
ALK PHOS: 72 U/L (ref 38–126)
ALT: 77 U/L — AB (ref 17–63)
AST: 48 U/L — ABNORMAL HIGH (ref 15–41)
Albumin: 4.2 g/dL (ref 3.5–5.0)
Anion gap: 11 (ref 5–15)
BUN: 11 mg/dL (ref 6–20)
CALCIUM: 9.2 mg/dL (ref 8.9–10.3)
CO2: 23 mmol/L (ref 22–32)
CREATININE: 1.04 mg/dL (ref 0.61–1.24)
Chloride: 106 mmol/L (ref 101–111)
GFR calc non Af Amer: >60 mL/min (ref >60)
Glucose, Bld: 89 mg/dL (ref 65–99)
Potassium: 3.8 mmol/L (ref 3.5–5.1)
SODIUM: 140 mmol/L (ref 135–145)
Total Bilirubin: 0.5 mg/dL (ref 0.3–1.2)
Total Protein: 7.5 g/dL (ref 6.5–8.1)

## 2015-11-15 MED ORDER — CLINDAMYCIN PHOSPHATE 900 MG/50ML IV SOLN
900.0000 mg | Freq: Once | INTRAVENOUS | Status: AC
  Administered 2015-11-15: 900 mg via INTRAVENOUS
  Filled 2015-11-15: qty 50

## 2015-11-15 NOTE — ED Notes (Signed)
Patient presents for abscess from IV drug use Sunday (Heroin). Redness and warmth noted to right AC radiating down right forearm. Patient denies fever, N/V, decreased sensation, numbness or tingling. Rates pain 4/10.

## 2015-11-15 NOTE — ED Provider Notes (Signed)
CSN: [id number]     Arrival date & time 11/15/15  2208 History  By signing my name below, I, Ronney Lion, attest that this documentation has been prepared under the direction and in the presence of Elpidio Anis, PA-C. Electronically Signed: Ronney Lion, ED Scribe. 11/15/2015. 12:03 AM.    Chief Complaint  Patient presents with  . Abscess    The history is provided by the patient. No language interpreter was used.   HPI Comments: Kevin Parker is a 40 y.o. male with a history of heroin IVDA, who presents to the Emergency Department complaining of a gradual-onset, constant, gradually worsening localized area of pain, redness, and swelling on his right antecubital area radiating down his right forearm, that began 2 days ago after last injecting heroin 3 days ago. He reports a mild associated headache. He denies receiving any prior medical evaluations or treatments for this. He denies fever. Patient has NKDA.   History reviewed. No pertinent past medical history. History reviewed. No pertinent past surgical history. No family history on file. Social History  Substance Use Topics  . Smoking status: Current Every Day Smoker -- 1.00 packs/day  . Smokeless tobacco: None  . Alcohol Use: Yes     Comment: beer 12 pack per day; liquor pint/day    Review of Systems  Constitutional: Negative for fever.  Skin: Positive for color change (localized area of pain, redness, and swelling).  Neurological: Positive for headaches.  All other systems reviewed and are negative.  Allergies  Review of patient's allergies indicates no known allergies.  Home Medications   Prior to Admission medications   Not on File   BP 126/83 mmHg  Pulse 75  Temp(Src) 97.7 F (36.5 C) (Oral)  Resp 18  SpO2 100% Physical Exam  Constitutional: He is oriented to person, place, and time. He appears well-developed and well-nourished. No distress.  HENT:  Head: Normocephalic and atraumatic.  Eyes: Conjunctivae and EOM  are normal.  Neck: Neck supple. No tracheal deviation present.  Cardiovascular: Normal rate and intact distal pulses.   Pulmonary/Chest: Effort normal. No respiratory distress.  Musculoskeletal: Normal range of motion.  Neurological: He is alert and oriented to person, place, and time.  Skin: Skin is warm and dry.  Right antecubital swelling, without fluctuance. There is extension of the redness distally into volar form. No ulceration, lesion, or drainage. No proximal redness or streaking.   Psychiatric: He has a normal mood and affect. His behavior is normal.  Nursing note and vitals reviewed.   ED Course  Procedures (including critical care time) Results for orders placed or performed during the hospital encounter of 11/15/15  CBC with Differential  Result Value Ref Range   WBC 9.5 4.0 - 10.5 K/uL   RBC 4.40 4.22 - 5.81 MIL/uL   Hemoglobin 14.3 13.0 - 17.0 g/dL   HCT 40.9 81.1 - 91.4 %   MCV 93.4 78.0 - 100.0 fL   MCH 32.5 26.0 - 34.0 pg   MCHC 34.8 30.0 - 36.0 g/dL   RDW 78.2 95.6 - 21.3 %   Platelets 245 150 - 400 K/uL   Neutrophils Relative % 60 %   Neutro Abs 5.7 1.7 - 7.7 K/uL   Lymphocytes Relative 30 %   Lymphs Abs 2.8 0.7 - 4.0 K/uL   Monocytes Relative 9 %   Monocytes Absolute 0.8 0.1 - 1.0 K/uL   Eosinophils Relative 1 %   Eosinophils Absolute 0.1 0.0 - 0.7 K/uL   Basophils Relative 0 %  Basophils Absolute 0.0 0.0 - 0.1 K/uL  Comprehensive metabolic panel  Result Value Ref Range   Sodium 140 135 - 145 mmol/L   Potassium 3.8 3.5 - 5.1 mmol/L   Chloride 106 101 - 111 mmol/L   CO2 23 22 - 32 mmol/L   Glucose, Bld 89 65 - 99 mg/dL   BUN 11 6 - 20 mg/dL   Creatinine, Ser 1.611.04 0.61 - 1.24 mg/dL   Calcium 9.2 8.9 - 09.610.3 mg/dL   Total Protein 7.5 6.5 - 8.1 g/dL   Albumin 4.2 3.5 - 5.0 g/dL   AST 48 (H) 15 - 41 U/L   ALT 77 (H) 17 - 63 U/L   Alkaline Phosphatase 72 38 - 126 U/L   Total Bilirubin 0.5 0.3 - 1.2 mg/dL   GFR calc non Af Amer >60 >60 mL/min   GFR  calc Af Amer >60 >60 mL/min   Anion gap 11 5 - 15    DIAGNOSTIC STUDIES: Oxygen Saturation is 100% on RA, normal by my interpretation.    COORDINATION OF CARE: 10:45 PM - Discussed treatment plan with pt at bedside which includes blood tests and IV antibiotics. Pt verbalized understanding and agreed to plan.   Labs Review Labs Reviewed - No data to display  Imaging Review No results found. I have personally reviewed and evaluated these images and lab results as part of my medical decision-making.   EKG Interpretation None      MDM   Final diagnoses:  None    1. Cellulitis right arm 2. IV drug abuse  The patient is examined by myself and Dr. Donnald GarrePfeiffer. No drainable abscess felt to be present. ?Thrombophlebitis. Will have the patient return tomorrow for doppler study of right arm. Home on Clindamycin. The patient states he will return tomorrow for further studies and recheck.    I personally performed the services described in this documentation, which was scribed in my presence. The recorded information has been reviewed and is accurate.     Elpidio AnisShari Geraldo Haris, PA-C 11/16/15 0006  Arby BarretteMarcy Pfeiffer, MD 11/16/15 [phone]

## 2015-11-16 ENCOUNTER — Encounter (HOSPITAL_COMMUNITY): Payer: Self-pay | Admitting: Emergency Medicine

## 2015-11-16 ENCOUNTER — Emergency Department (HOSPITAL_COMMUNITY): Payer: Self-pay

## 2015-11-16 ENCOUNTER — Emergency Department (EMERGENCY_DEPARTMENT_HOSPITAL): Admit: 2015-11-16 | Discharge: 2015-11-16 | Disposition: A | Discharge status: Home / Self Care | Payer: Self-pay

## 2015-11-16 ENCOUNTER — Emergency Department (HOSPITAL_COMMUNITY)
Admission: EM | Admit: 2015-11-16 | Discharge: 2015-11-16 | Disposition: A | Discharge status: Home / Self Care | Payer: PRIVATE HEALTH INSURANCE | Attending: Emergency Medicine | Admitting: Emergency Medicine

## 2015-11-16 DIAGNOSIS — L02413 Cutaneous abscess of right upper limb: Secondary | ICD-10-CM | POA: Insufficient documentation

## 2015-11-16 DIAGNOSIS — F17200 Nicotine dependence, unspecified, uncomplicated: Secondary | ICD-10-CM | POA: Insufficient documentation

## 2015-11-16 DIAGNOSIS — L02419 Cutaneous abscess of limb, unspecified: Secondary | ICD-10-CM

## 2015-11-16 DIAGNOSIS — M79609 Pain in unspecified limb: Secondary | ICD-10-CM

## 2015-11-16 DIAGNOSIS — I808 Phlebitis and thrombophlebitis of other sites: Secondary | ICD-10-CM | POA: Insufficient documentation

## 2015-11-16 DIAGNOSIS — I809 Phlebitis and thrombophlebitis of unspecified site: Secondary | ICD-10-CM

## 2015-11-16 DIAGNOSIS — M79601 Pain in right arm: Secondary | ICD-10-CM

## 2015-11-16 LAB — I-STAT CHEM 8, ED
BUN: 15 mg/dL (ref 6–20)
CHLORIDE: 106 mmol/L (ref 101–111)
Calcium, Ion: 1.1 mmol/L — ABNORMAL LOW (ref 1.12–1.23)
Creatinine, Ser: 1.2 mg/dL (ref 0.61–1.24)
Glucose, Bld: 125 mg/dL — ABNORMAL HIGH (ref 65–99)
HCT: 45 % (ref 39.0–52.0)
Hemoglobin: 15.3 g/dL (ref 13.0–17.0)
POTASSIUM: 3.8 mmol/L (ref 3.5–5.1)
SODIUM: 142 mmol/L (ref 135–145)
TCO2: 24 mmol/L (ref 0–100)

## 2015-11-16 MED ORDER — CLINDAMYCIN HCL 300 MG PO CAPS
300.0000 mg | ORAL_CAPSULE | Freq: Once | ORAL | Status: AC
  Administered 2015-11-16: 300 mg via ORAL
  Filled 2015-11-16 (×2): qty 1

## 2015-11-16 MED ORDER — ASPIRIN 81 MG PO CHEW: 81.0000 mg | CHEWABLE_TABLET | Freq: Every day | ORAL | Status: DC

## 2015-11-16 MED ORDER — IOPAMIDOL (ISOVUE-370) INJECTION 76%
100.0000 mL | Freq: Once | INTRAVENOUS | Status: AC | PRN
  Administered 2015-11-16: 100 mL via INTRAVENOUS

## 2015-11-16 MED ORDER — OXYCODONE-ACETAMINOPHEN 5-325 MG PO TABS
2.0000 | ORAL_TABLET | Freq: Once | ORAL | Status: AC
  Administered 2015-11-16: 2 via ORAL
  Filled 2015-11-16: qty 2

## 2015-11-16 MED ORDER — CLINDAMYCIN HCL 150 MG PO CAPS: 300.0000 mg | ORAL_CAPSULE | Freq: Four times a day (QID) | ORAL | Status: DC

## 2015-11-16 NOTE — ED Provider Notes (Signed)
CSN: [id number]     Arrival date & time 11/16/15  1656 History  By signing my name below, I, Kevin Parker, attest that this documentation has been prepared under the direction and in the presence of Mady Gemma, PA-C. Electronically Signed: Doreatha Parker, ED Scribe. 11/16/2015. 5:29 PM.     Chief Complaint  Patient presents with  . Abscess    The history is provided by the patient. No language interpreter was used.    HPI Comments: Kevin Parker is a 40 y.o. male who presents to the Emergency Department complaining of an area of moderate, gradually worsening pain, erythema and swelling to the antecubital aspect of the right arm onset 3 days ago. Pt was initially evaluated in the ED yesterday for the same symptoms s/p IVDU 4 days ago. He was sent home on clindamycin and instructed to return to the ED today for doppler imaging. Pt states that his swelling has increased since last night, but the erythema and pain have remained unchanged. He reports that he has not started his course of antibiotics prescribed yesterday. He is able to flex and extend the right elbow and fingers with minimal difficulty. Denies fever, chills, numbness or weakness.    History reviewed. No pertinent past medical history. History reviewed. No pertinent past surgical history. History reviewed. No pertinent family history. Social History  Substance Use Topics  . Smoking status: Current Every Day Smoker -- 1.00 packs/day  . Smokeless tobacco: None  . Alcohol Use: Yes     Comment: beer 12 pack per day; liquor pint/day      Review of Systems  Constitutional: Negative for fever and chills.  Skin: Positive for color change (area of pain, swelling, and erythema to the antecubital aspect of the right arm).  Neurological: Negative for weakness and numbness.     Allergies  Review of patient's allergies indicates no known allergies.  Home Medications   Prior to Admission medications   Medication Sig Start  Date End Date Taking? Authorizing Provider  clindamycin (CLEOCIN) 150 MG capsule Take 2 capsules (300 mg total) by mouth every 6 (six) hours. 11/16/15   Shari Upstill, PA-C    BP 129/68 mmHg  Pulse 70  Temp(Src) 97.8 F (36.6 C) (Oral)  Resp 16  Ht 6' (1.829 m)  Wt 87.544 kg  BMI 26.17 kg/m2  SpO2 100% Physical Exam  Constitutional: He is oriented to person, place, and time. He appears well-developed and well-nourished. No distress.  HENT:  Head: Normocephalic and atraumatic.  Right Ear: External ear normal.  Left Ear: External ear normal.  Nose: Nose normal.  Eyes: Conjunctivae and EOM are normal. Right eye exhibits no discharge. Left eye exhibits no discharge. No scleral icterus.  Neck: Normal range of motion. Neck supple.  Cardiovascular: Normal rate and regular rhythm.   Pulmonary/Chest: Effort normal and breath sounds normal. No respiratory distress.  Musculoskeletal: Normal range of motion. He exhibits tenderness. He exhibits no edema.  2 cm area of induration to right antecubital fossa with associated TTP. Erythema to volar aspect of right forearm.  Neurological: He is alert and oriented to person, place, and time.  Skin: Skin is warm and dry. He is not diaphoretic. There is erythema.  Psychiatric: He has a normal mood and affect. His behavior is normal.  Nursing note and vitals reviewed.         ED Course  Procedures (including critical care time)  DIAGNOSTIC STUDIES: Oxygen Saturation is 98% on RA, normal by my  interpretation.    COORDINATION OF CARE: 5:27 PM Discussed treatment plan with pt at bedside which includes US and pt agreed to plan.   Labs Review Labs Reviewed  I-STAT CHEM 8, ED    Imaging Review No results found. I have personally reviewed and evaluated these images and lab results as part of my medical decision-making.   MDM   Final diagnoses:  Upper extremity pain, anterior, right    40 year old male presents with pain and swelling to  his right forearm. States his symptoms started after her injected heroin over the weekend. Was evaluated in the ED yesterday and started on clindamycin, which he has not yet filled. He was instructed to follow-up for US today. He reports increased pain, though denies worsening redness. Patient is afebrile. Vital signs stable. On exam, he has a 2 cm area of induration to his right antecubital fossa with associated TTP. No fluctuance. Additionally, he has erythema to the volar aspect of his right forearm. Full ROM. Patient is NVI.  Patient given pain medication and dose of clindamycin. Will obtain venous US of right upper extremity.  Per vascular tech, UA remarkable for "evidence of a heterogenous area with peripheral low-resistant arterial flow in the right antecubital fossa, which appears to be adjacent to the basilic vein; cannot exclude possible abscess, versus thrombus, versus enlarged lymph node, versus mass, versus unknown etiology."  Spoke with Dr. Freida BusmanAllen. Will consult vascular. Spoke with Dr. Edilia Boickson, who advised this is an unusual finding and that the patient can come back for a repeat study in 48 hours; asked if there is additional imaging that would be helpful, recommended CT angio. Discussed options with the patient, who states he cannot come back in 2 days due to not being able to miss any more work. Will order CT angio of right upper extremity.   Patient signed out to [facility] HealthKelly Humes, PA-C at shift change. Plan to follow-up imaging and disposition accordingly.  BP 129/68 mmHg  Pulse 70  Temp(Src) 97.8 F (36.6 C) (Oral)  Resp 16  Ht 6' (1.829 m)  Wt 87.544 kg  BMI 26.17 kg/m2  SpO2 100%   I personally performed the services described in this documentation, which was scribed in my presence. The recorded information has been reviewed and is accurate.   Mady Gemmalizabeth C Westfall, PA-C 11/16/15 2042  Lorre NickAnthony Allen, MD 11/20/15 1537

## 2015-11-16 NOTE — ED Provider Notes (Signed)
8:45 PM Patient care assumed from [facility] Westfall, PA-C at shift change. Patient pending CT of his RUE to further evaluate swelling and redness around right AC. US revealing abnormal arterial flow at the right AC. Hx of IVDU. Question abscess. Patient neurovascularly intact and afebrile. Will follow imaging and reassess.  9:45 PM Patient returned from CT. Ambulated back to room in no distress.  10:40 PM Imaging findings reviewed and discussed with Dr. Freida BusmanAllen. He recommends repeat vascular surgery consult to ensure no need to initiate anticoagulants; suspect patient will be adequately controlled with aspirin. Will also refer to hand surgery outpatient regarding developing abscess. Plan to stress to patient the need to complete course of Clindamycin. Consult order placed.  11:00 PM Case reviewed with Dr. Edilia Boickson who agrees with initiation of aspirin therapy. No indication for outpatient follow up from a vascular standpoint currently. CT findings and plan reviewed with the patient at length. He verbalizes understanding. Return precautions discussed and provided. Patient discharged in satisfactory condition with no unaddressed concerns.  Ct Angio Up Extrem Right W/cm &/or Wo/cm  11/16/2015  CLINICAL DATA:  Acute onset of right forearm pain and swelling after injecting heroin. Induration at the right antecubital fossa. Question of abscess or thrombus at the antecubital fossa on ultrasound. Initial encounter. EXAM: CT ANGIOGRAPHY OF THE RIGHT UPPER EXTREMITY TECHNIQUE: Multidetector CT imaging of the right upper extremity was performed using the standard protocol during bolus administration of intravenous contrast. Multiplanar CT image reconstructions and MIPs were obtained to evaluate the vascular anatomy. CONTRAST:  100 mL of Isovue 370 IV contrast COMPARISON:  None. FINDINGS: The venous vasculature is difficult to fully assess due to the phase of contrast opacification. However, there is focal fluid and  peripheral enhancement at the antecubital fossa surrounding the cephalic vein at this level, measuring 1.4 x 1.1 cm, suspicious for an evolving abscess adjacent to the vein. There is nonopacification of the cephalic vein extending to the level of the wrist, with mild associated soft tissue edema, suspicious for at least partial thrombosis of the cephalic vein. Focal skin thickening is noted at the antecubital fossa, with surrounding soft tissue edema. Contrast is seen within the more proximal portions of the cephalic vein. The underlying musculature appears grossly unremarkable. The visualized arterial vasculature is unremarkable in appearance. The visualized osseous structures are within normal limits. Slightly prominent right axillary nodes measure up to 1.3 cm in short axis. The visualized portions of the right lung are clear, aside from a few blebs at the right lung apex. Review of the MIP images confirms the above findings. IMPRESSION: 1. Visualized arterial vasculature is unremarkable in appearance. 2. Suspect at least partial thrombosis of the cephalic vein from the antecubital fossa distally to the level of the wrist. This is not well assessed given the phase of contrast enhancement. Surrounding soft tissue inflammation noted. 3. Focal fluid and peripheral enhancement at the antecubital fossa surrounding the cephalic vein at this level, measuring 1.4 x 1.1 cm, suspicious for a small evolving abscess adjacent to the vein. 4. Slightly prominent right axillary nodes noted. Electronically Signed   By: Roanna RaiderJeffery  Parker M.D.   On: 11/16/2015 22:32          Kevin MaduraKelly Britania Shreeve, PA-C 11/16/15 2329  Lorre NickAnthony Allen, MD 11/20/15 1537

## 2015-11-16 NOTE — ED Notes (Signed)
Pt presents c/o left inner elbow abscess, worse since he was seen here last night.  No abx yet as he did not get his rx filled.  Pain 8/10.

## 2015-11-16 NOTE — Discharge Instructions (Signed)

## 2015-11-16 NOTE — Discharge Instructions (Signed)
Your CT scan showed you have superficial thrombophlebitis to your right arm extending down to your wrist. We recommend he take an aspirin daily as prescribed for this. Apply warm compresses as instructed below. You must also take clindamycin as previously prescribed to you as your CT also showed a small 1 cm x 1 cm abscess. Take 2 tablets of this medication every 6 hours until finished. Take tylenol or ibuprofen for pain control. Follow up with a hand surgeon if the swelling in your elbow worsens. Return to the ED if swelling worsens and/or if you develop a fever over 100.11F.  Phlebitis Phlebitis is soreness and swelling (inflammation) of a vein. This can occur in your arms, legs, or torso (trunk), as well as deeper inside your body. Phlebitis is usually not serious when it occurs close to the surface of the body. However, it can cause serious problems when it occurs in a vein deeper inside the body. CAUSES  Phlebitis can be triggered by various things, including:   Reduced blood flow through your veins. This can happen with:  Bed rest over a long period.  Long-distance travel.  Injury.  Surgery.  Being overweight (obese) or pregnant.  Having an IV tube put in the vein and getting certain medicines through the vein.  Cancer and cancer treatment.  Use of illegal drugs taken through the vein.  Inflammatory diseases.  Inherited (genetic) diseases that increase the risk of blood clots.  Hormone therapy, such as birth control pills. SIGNS AND SYMPTOMS   Red, tender, swollen, and painful area on your skin. Usually, the area will be long and narrow.  Firmness along the center of the affected area. This can indicate that a blood clot has formed.  Low-grade fever. DIAGNOSIS  A health care provider can usually diagnose phlebitis by examining the affected area and asking about your symptoms. To check for infection or blood clots, your health care provider may order blood tests or an  ultrasound exam of the area. Blood tests and your family history may also indicate if you have an underlying genetic disease that causes blood clots. Occasionally, a piece of tissue is taken from the body (biopsy sample) if an unusual cause of phlebitis is suspected. TREATMENT  Treatment will vary depending on the severity of the condition and the area of the body affected. Treatment may include:  Use of a warm compress or heating pad.  Use of compression stockings or bandages.  Anti-inflammatory medicines.  Removal of any IV tube that may be causing the problem.  Medicines that kill germs (antibiotics) if an infection is present.  Blood-thinning medicines if a blood clot is suspected or present.  In rare cases, surgery may be needed to remove damaged sections of vein. HOME CARE INSTRUCTIONS   Only take over-the-counter or prescription medicines as directed by your health care provider. Take all medicines exactly as prescribed.  Raise (elevate) the affected area above the level of your heart as directed by your health care provider.  Apply a warm compress or heating pad to the affected area as directed by your health care provider. Do not sleep with the heating pad.  Use compression stockings or bandages as directed. These will speed healing and prevent the condition from coming back.  If you are on blood thinners:  Get follow-up blood tests as directed by your health care provider.  Check with your health care provider before using any new medicines.  Carry a medical alert card or wear your medical  alert jewelry to show that you are on blood thinners.  For phlebitis in the legs:  Avoid prolonged standing or bed rest.  Keep your legs moving. Raise your legs when sitting or lying.  Do not smoke.  Women, particularly those over the age of 69, should consider the risks and benefits of taking the contraceptive pill. This kind of hormone treatment can increase your risk for  blood clots.  Follow up with your health care provider as directed. SEEK MEDICAL CARE IF:   You have unusual bruising or any bleeding problems.  Your swelling or pain in the affected area is not improving.  You are on anti-inflammatory medicine, and you develop belly (abdominal) pain. SEEK IMMEDIATE MEDICAL CARE IF:   You have a sudden onset of chest pain or difficulty breathing.  You have a fever or persistent symptoms for more than 2-3 days.  You have a fever and your symptoms suddenly get worse. MAKE SURE YOU:  Understand these instructions.  Will watch your condition.  Will get help right away if you are not doing well or get worse.   This information is not intended to replace advice given to you by your health care provider. Make sure you discuss any questions you have with your health care provider.   Document Released: 07/16/2001 Document Revised: 05/12/2013 Document Reviewed: 03/29/2013 Elsevier Interactive Patient Education [employer]

## 2015-11-16 NOTE — ED Notes (Signed)
This nurse emphasized the importance of follow up and taking his prescribed medication

## 2015-11-16 NOTE — Progress Notes (Addendum)
*  Preliminary Results* Right upper extremity venous duplex completed. Right upper extremity is negative for deep vein thrombosis.   Incidental finding: There is evidence of a heterogenous area with peripheral low-resistant arterial flow in the right antecubital fossa , which appears to be adjacent to the basilic vein. Cannot exclude possible abscess, versus thrombus, versus enlarged lymph node, versus mass, versus unknown etiology.  Preliminary results discussed with Glean HessElizabeth Westfall, PA-C.  11/16/2015 8:24 PM  Gertie FeyMichelle Kenedie Dirocco, RVT, RDCS, RDMS

## 2016-06-04 ENCOUNTER — Ambulatory Visit: Payer: Self-pay | Attending: Internal Medicine | Admitting: Internal Medicine

## 2016-06-04 ENCOUNTER — Encounter: Payer: Self-pay | Admitting: Internal Medicine

## 2016-06-04 ENCOUNTER — Encounter: Payer: Self-pay | Admitting: Licensed Clinical Social Worker

## 2016-06-04 VITALS — BP 153/97 | HR 88 | Temp 98.2°F | Resp 16 | Wt 177.8 lb

## 2016-06-04 DIAGNOSIS — R030 Elevated blood-pressure reading, without diagnosis of hypertension: Secondary | ICD-10-CM | POA: Insufficient documentation

## 2016-06-04 DIAGNOSIS — Z23 Encounter for immunization: Secondary | ICD-10-CM

## 2016-06-04 DIAGNOSIS — F1010 Alcohol abuse, uncomplicated: Secondary | ICD-10-CM | POA: Insufficient documentation

## 2016-06-04 DIAGNOSIS — B1920 Unspecified viral hepatitis C without hepatic coma: Secondary | ICD-10-CM | POA: Insufficient documentation

## 2016-06-04 DIAGNOSIS — F17210 Nicotine dependence, cigarettes, uncomplicated: Secondary | ICD-10-CM | POA: Insufficient documentation

## 2016-06-04 DIAGNOSIS — Z720 Tobacco use: Secondary | ICD-10-CM

## 2016-06-04 DIAGNOSIS — Z7982 Long term (current) use of aspirin: Secondary | ICD-10-CM | POA: Insufficient documentation

## 2016-06-04 DIAGNOSIS — F411 Generalized anxiety disorder: Secondary | ICD-10-CM | POA: Insufficient documentation

## 2016-06-04 LAB — LIPID PANEL
CHOLESTEROL: 213 mg/dL — AB (ref 125–200)
HDL: 73 mg/dL (ref >=40)
LDL Cholesterol: 115 mg/dL (ref <130)
TRIGLYCERIDES: 127 mg/dL (ref <150)
Total CHOL/HDL Ratio: 2.9 Ratio (ref <=5.0)
VLDL: 25 mg/dL (ref <30)

## 2016-06-04 LAB — CBC WITH DIFFERENTIAL/PLATELET
BASOS ABS: 61 {cells}/uL (ref 0–200)
Basophils Relative: 1 %
EOS PCT: 1 %
Eosinophils Absolute: 61 cells/uL (ref 15–500)
HCT: 49.1 % (ref 38.5–50.0)
Hemoglobin: 17 g/dL (ref 13.2–17.1)
Lymphocytes Relative: 26 %
Lymphs Abs: 1586 cells/uL (ref 850–3900)
MCH: 34.5 pg — AB (ref 27.0–33.0)
MCHC: 34.6 g/dL (ref 32.0–36.0)
MCV: 99.6 fL (ref 80.0–100.0)
MONOS PCT: 11 %
MPV: 11.5 fL (ref 7.5–12.5)
Monocytes Absolute: 671 cells/uL (ref 200–950)
NEUTROS ABS: 3721 {cells}/uL (ref 1500–7800)
NEUTROS PCT: 61 %
PLATELETS: 259 10*3/uL (ref 140–400)
RBC: 4.93 MIL/uL (ref 4.20–5.80)
RDW: 15.4 % — ABNORMAL HIGH (ref 11.0–15.0)
WBC: 6.1 10*3/uL (ref 3.8–10.8)

## 2016-06-04 LAB — CMP AND LIVER
ALBUMIN: 4.3 g/dL (ref 3.6–5.1)
ALK PHOS: 72 U/L (ref 40–115)
ALT: 169 U/L — ABNORMAL HIGH (ref 9–46)
AST: 212 U/L — ABNORMAL HIGH (ref 10–40)
BILIRUBIN INDIRECT: 0.4 mg/dL (ref 0.2–1.2)
BILIRUBIN TOTAL: 0.5 mg/dL (ref 0.2–1.2)
BUN: 19 mg/dL (ref 7–25)
Bilirubin, Direct: 0.1 mg/dL (ref <=0.2)
CALCIUM: 9.3 mg/dL (ref 8.6–10.3)
CO2: 29 mmol/L (ref 20–31)
Chloride: 104 mmol/L (ref 98–110)
Creat: 0.99 mg/dL (ref 0.60–1.35)
GLUCOSE: 87 mg/dL (ref 65–99)
POTASSIUM: 4.7 mmol/L (ref 3.5–5.3)
SODIUM: 141 mmol/L (ref 135–146)
TOTAL PROTEIN: 7.6 g/dL (ref 6.1–8.1)

## 2016-06-04 NOTE — Progress Notes (Signed)
Kevin Parker, is a 40 y.o. male  [id number]  [id number]  DOB - [birth year]  CC:  Chief Complaint  Patient presents with  . Establish Care       HPI: Kevin Parker is a 40 y.o. male here today to establish medical care., no pcp, new to our clinic. Per pt, went to health dept 2 months ago, found to have hep c.    Hx of ivdu years ago, not currently.  Smokes 0.5-1ppd, x 20 years. Drinking up to 6pack beer daily now. Denies si/hi/avh.  Planning to go to [facility] next week, and has appt [facility]/ [facility] at [facility] today for Anxiety/depression.  Per pt, no prior hx of htn.  Patient has No headache, No chest pain, No abdominal pain - No Nausea, No new weakness tingling or numbness, No Cough - SOB.    Review of Systems: Constitutional: Negative for fever, chills, diaphoresis, activity change, appetite change and fatigue. HENT: Negative for ear pain, nosebleeds, congestion, facial swelling, rhinorrhea, neck pain, neck stiffness and ear discharge.  Eyes: Negative for pain, discharge, redness, itching and visual disturbance. Respiratory: Negative for cough, choking, chest tightness, shortness of breath, wheezing and stridor.  Cardiovascular: Negative for chest pain, palpitations and leg swelling. Gastrointestinal: Negative for abdominal distention. Genitourinary: Negative for dysuria, urgency, frequency, hematuria, flank pain, decreased urine volume, difficulty urinating and dyspareunia.  Musculoskeletal: Negative for back pain, joint swelling, arthralgia and gait problem. Neurological: Negative for dizziness, tremors, seizures, syncope, facial asymmetry, speech difficulty, weakness, light-headedness, numbness and headaches.  Hematological: Negative for adenopathy. Does not bruise/bleed easily. Psychiatric/Behavioral: Negative for hallucinations, behavioral problems, confusion, dysphoric mood, decreased concentration and agitation.    No Known Allergies No past medical  history on file. Current Outpatient Prescriptions on File Prior to Visit  Medication Sig Dispense Refill  . aspirin 81 MG chewable tablet Chew 1 tablet (81 mg total) by mouth daily. (Patient not taking: Reported on 06/04/2016) 30 tablet 1  . clindamycin (CLEOCIN) 150 MG capsule Take 2 capsules (300 mg total) by mouth every 6 (six) hours. (Patient not taking: Reported on 06/04/2016) 60 capsule 0   No current facility-administered medications on file prior to visit.    No family history on file. Social History   Social History  . Marital status: Single    Spouse name: N/A  . Number of children: N/A  . Years of education: N/A   Occupational History  . Not on file.   Social History Main Topics  . Smoking status: Current Every Day Smoker    Packs/day: 1.00  . Smokeless tobacco: Not on file  . Alcohol use Yes     Comment: beer 12 pack per day; liquor pint/day  . Drug use:     Types: Marijuana  . Sexual activity: Not on file   Other Topics Concern  . Not on file   Social History Narrative  . No narrative on file    Objective:   Vitals:   06/04/16 1021  BP: (!) 153/97  Pulse: 88  Resp: 16  Temp: 98.2 F (36.8 C)    Filed Weights   06/04/16 1021  Weight: 177 lb 12.8 oz (80.6 kg)    BP Readings from Last 3 Encounters:  06/04/16 (!) 153/97  11/16/15 127/86  11/16/15 118/71    Physical Exam: Constitutional: Patient appears well-developed and well-nourished. No distress. AAOx3, pleasant. HENT: Normocephalic, atraumatic, External right and left ear normal. Oropharynx is clear and moist.  bilat TMs  clear. Eyes: Conjunctivae and EOM are normal. PERRL, no scleral icterus. Neck: Normal ROM. Neck supple. No JVD.  CVS: RRR, S1/S2 +, no murmurs, no gallops, no carotid bruit.  Pulmonary: Effort and breath sounds normal, no stridor, rhonchi, wheezes, rales.  Abdominal: Soft. BS +, no distension, tenderness, rebound or guarding.  Musculoskeletal: Normal range of motion. No  edema and no tenderness.  LE: bilat/ no c/c/e, pulses 2+ bilateral. Neuro: Alert. muscle tone coordination wnl. No cranial nerve deficit grossly. Skin: Skin is warm and dry. No rash noted. Not diaphoretic. No erythema. No pallor. Psychiatric: Normal mood and affect. Behavior, judgment, thought content normal.  Lab Results  Component Value Date   WBC 9.5 11/15/2015   HGB 15.3 11/16/2015   HCT 45.0 11/16/2015   MCV 93.4 11/15/2015   PLT 245 11/15/2015   Lab Results  Component Value Date   CREATININE 1.20 11/16/2015   BUN 15 11/16/2015   NA 142 11/16/2015   K 3.8 11/16/2015   CL 106 11/16/2015   CO2 23 11/15/2015    No results found for: HGBA1C Lipid Panel  No results found for: CHOL, TRIG, HDL, CHOLHDL, VLDL, LDLCALC     Depression screen PHQ 2/9 06/04/2016  Decreased Interest 0  Down, Depressed, Hopeless 1  PHQ - 2 Score 1    Assessment and plan:   1. Hepatitis C virus infection without hepatic coma, unspecified chronicity - dw pt ramifications of untreated hep c.  Recd safe sex, avoid sharing of needles, toothbrushes. - Acute Hep Panel & Hep B Surface Ab - Hepatitis C Ab Reflex HCV RNA, QUANT - CMP and Liver - CBC with Differential - Lipid Panel - [facility]  2. Anxiety state - has appt [facility]/ [facility] today a pm, may benefit w/ Welbutrin since could help w/ tob cessation as well.  3. Alcohol abuse Complete cessation recd, planning to go to [facility] rehab next week. Per pt, they have a scholarship for him  4. Tobacco abuse Kevin Parker was counseled on the dangers of tobacco use, and was advised to quit. Reviewed strategies to maximize success, including removing cigarettes and smoking materials from environment, stress management and support of family/friends.  5. elavated bp Review of recds did not note htn in 4/17 ed visit, may be related to anxiety - recd DASH diet, watch salt.  - f/u chk.  Return in about 3 months (around  09/04/2016).  The patient was given clear instructions to go to ER or return to medical center if symptoms don't improve, worsen or new problems develop. The patient verbalized understanding. The patient was told to call to get lab results if they haven't heard anything in the next week.    This note has been created with Education officer, environmentalDragon speech recognition software and smart phrase technology. Any transcriptional errors are unintentional.   Pete Glatterawn T Langeland, MD, MBA/MHA [facility] [place], [place] [phone]   06/04/2016, 10:49 AM

## 2016-06-04 NOTE — Patient Instructions (Addendum)
* financial aid packet  Alcohol Use Disorder Alcohol use disorder is a mental disorder. It is not a one-time incident of heavy drinking. Alcohol use disorder is the excessive and uncontrollable use of alcohol over time that leads to problems with functioning in one or more areas of daily living. People with this disorder risk harming themselves and others when they drink to excess. Alcohol use disorder also can cause other mental disorders, such as mood and anxiety disorders, and serious physical problems. People with alcohol use disorder often misuse other drugs.  Alcohol use disorder is common and widespread. Some people with this disorder drink alcohol to cope with or escape from negative life events. Others drink to relieve chronic pain or symptoms of mental illness. People with a family history of alcohol use disorder are at higher risk of losing control and using alcohol to excess.  Drinking too much alcohol can cause injury, accidents, and health problems. One drink can be too much when you are:  Working.  Pregnant or breastfeeding.  Taking medicines. Ask your doctor.  Driving or planning to drive. SYMPTOMS  Signs and symptoms of alcohol use disorder may include the following:   Consumption ofalcohol inlarger amounts or over a longer period of time than intended.  Multiple unsuccessful attempts to cutdown or control alcohol use.   A great deal of time spent obtaining alcohol, using alcohol, or recovering from the effects of alcohol (hangover).  A strong desire or urge to use alcohol (cravings).   Continued use of alcohol despite problems at work, school, or home because of alcohol use.   Continued use of alcohol despite problems in relationships because of alcohol use.  Continued use of alcohol in situations when it is physically hazardous, such as driving a car.  Continued use of alcohol despite awareness of a physical or psychological problem that is likely related to  alcohol use. Physical problems related to alcohol use can involve the brain, heart, liver, stomach, and intestines. Psychological problems related to alcohol use include intoxication, depression, anxiety, psychosis, delirium, and dementia.   The need for increased amounts of alcohol to achieve the same desired effect, or a decreased effect from the consumption of the same amount of alcohol (tolerance).  Withdrawal symptoms upon reducing or stopping alcohol use, or alcohol use to reduce or avoid withdrawal symptoms. Withdrawal symptoms include:  Racing heart.  Hand tremor.  Difficulty sleeping.  Nausea.  Vomiting.  Hallucinations.  Restlessness.  Seizures. DIAGNOSIS Alcohol use disorder is diagnosed through an assessment by your health care provider. Your health care provider may start by asking three or four questions to screen for excessive or problematic alcohol use. To confirm a diagnosis of alcohol use disorder, at least two symptoms must be present within a 42-month period. The severity of alcohol use disorder depends on the number of symptoms:  Mild--two or three.  Moderate--four or five.  Severe--six or more. Your health care provider may perform a physical exam or use results from lab tests to see if you have physical problems resulting from alcohol use. Your health care provider may refer you to a mental health professional for evaluation. TREATMENT  Some people with alcohol use disorder are able to reduce their alcohol use to low-risk levels. Some people with alcohol use disorder need to quit drinking alcohol. When necessary, mental health professionals with specialized training in substance use treatment can help. Your health care provider can help you decide how severe your alcohol use disorder is and what  type of treatment you need. The following forms of treatment are available:   Detoxification. Detoxification involves the use of prescription medicines to prevent  alcohol withdrawal symptoms in the first week after quitting. This is important for people with a history of symptoms of withdrawal and for heavy drinkers who are likely to have withdrawal symptoms. Alcohol withdrawal can be dangerous and, in severe cases, cause death. Detoxification is usually provided in a hospital or in-patient substance use treatment facility.  Counseling or talk therapy. Talk therapy is provided by substance use treatment counselors. It addresses the reasons people use alcohol and ways to keep them from drinking again. The goals of talk therapy are to help people with alcohol use disorder find healthy activities and ways to cope with life stress, to identify and avoid triggers for alcohol use, and to handle cravings, which can cause relapse.  Medicines.Different medicines can help treat alcohol use disorder through the following actions:  Decrease alcohol cravings.  Decrease the positive reward response felt from alcohol use.  Produce an uncomfortable physical reaction when alcohol is used (aversion therapy).  Support groups. Support groups are run by people who have quit drinking. They provide emotional support, advice, and guidance. These forms of treatment are often combined. Some people with alcohol use disorder benefit from intensive combination treatment provided by specialized substance use treatment centers. Both inpatient and outpatient treatment programs are available.   This information is not intended to replace advice given to you by your health care provider. Make sure you discuss any questions you have with your health care provider.   Document Released: 08/29/2004 Document Revised: 08/12/2014 Document Reviewed: 10/29/2012 Elsevier Interactive Patient Education 2016 Elsevier Inc.  - DASH Eating Plan DASH stands for "Dietary Approaches to Stop Hypertension." The DASH eating plan is a healthy eating plan that has been shown to reduce high blood pressure  (hypertension). Additional health benefits may include reducing the risk of type 2 diabetes mellitus, heart disease, and stroke. The DASH eating plan may also help with weight loss. WHAT DO I NEED TO KNOW ABOUT THE DASH EATING PLAN? For the DASH eating plan, you will follow these general guidelines:  Choose foods with a percent daily value for sodium of less than 5% (as listed on the food label).  Use salt-free seasonings or herbs instead of table salt or sea salt.  Check with your health care provider or pharmacist before using salt substitutes.  Eat lower-sodium products, often labeled as "lower sodium" or "no salt added."  Eat fresh foods.  Eat more vegetables, fruits, and low-fat dairy products.  Choose whole grains. Look for the word "whole" as the first word in the ingredient list.  Choose fish and skinless chicken or [place] more often than red meat. Limit fish, poultry, and meat to 6 oz (170 g) each day.  Limit sweets, desserts, sugars, and sugary drinks.  Choose heart-healthy fats.  Limit cheese to 1 oz (28 g) per day.  Eat more home-cooked food and less restaurant, buffet, and fast food.  Limit fried foods.  Cook foods using methods other than frying.  Limit canned vegetables. If you do use them, rinse them well to decrease the sodium.  When eating at a restaurant, ask that your food be prepared with less salt, or no salt if possible. WHAT FOODS CAN I EAT? Seek help from a dietitian for individual calorie needs. Grains Whole grain or whole wheat bread. Brown rice. Whole grain or whole wheat pasta. Quinoa, bulgur, and  whole grain cereals. Low-sodium cereals. Corn or whole wheat flour tortillas. Whole grain cornbread. Whole grain crackers. Low-sodium crackers. Vegetables Fresh or frozen vegetables (raw, steamed, roasted, or grilled). Low-sodium or reduced-sodium tomato and vegetable juices. Low-sodium or reduced-sodium tomato sauce and paste. Low-sodium or  reduced-sodium canned vegetables.  Fruits All fresh, canned (in natural juice), or frozen fruits. Meat and Other Protein Products Ground beef (85% or leaner), grass-fed beef, or beef trimmed of fat. Skinless chicken or [place]. Ground chicken or [place]. Pork trimmed of fat. All fish and seafood. Eggs. Dried beans, peas, or lentils. Unsalted nuts and seeds. Unsalted canned beans. Dairy Low-fat dairy products, such as skim or 1% milk, 2% or reduced-fat cheeses, low-fat ricotta or cottage cheese, or plain low-fat yogurt. Low-sodium or reduced-sodium cheeses. Fats and Oils Tub margarines without trans fats. Light or reduced-fat mayonnaise and salad dressings (reduced sodium). Avocado. Safflower, olive, or canola oils. Natural peanut or almond butter. Other Unsalted popcorn and pretzels. The items listed above may not be a complete list of recommended foods or beverages. Contact your dietitian for more options. WHAT FOODS ARE NOT RECOMMENDED? Grains White bread. White pasta. White rice. Refined cornbread. Bagels and croissants. Crackers that contain trans fat. Vegetables Creamed or fried vegetables. Vegetables in a cheese sauce. Regular canned vegetables. Regular canned tomato sauce and paste. Regular tomato and vegetable juices. Fruits Dried fruits. Canned fruit in light or heavy syrup. Fruit juice. Meat and Other Protein Products Fatty cuts of meat. Ribs, chicken wings, bacon, sausage, bologna, salami, chitterlings, fatback, hot dogs, bratwurst, and packaged luncheon meats. Salted nuts and seeds. Canned beans with salt. Dairy Whole or 2% milk, cream, half-and-half, and cream cheese. Whole-fat or sweetened yogurt. Full-fat cheeses or blue cheese. Nondairy creamers and whipped toppings. Processed cheese, cheese spreads, or cheese curds. Condiments Onion and garlic salt, seasoned salt, table salt, and sea salt. Canned and packaged gravies. Worcestershire sauce. Tartar sauce. Barbecue sauce. Teriyaki  sauce. Soy sauce, including reduced sodium. Steak sauce. Fish sauce. Oyster sauce. Cocktail sauce. Horseradish. Ketchup and mustard. Meat flavorings and tenderizers. Bouillon cubes. Hot sauce. Tabasco sauce. Marinades. Taco seasonings. Relishes. Fats and Oils Butter, stick margarine, lard, shortening, ghee, and bacon fat. Coconut, palm kernel, or palm oils. Regular salad dressings. Other Pickles and olives. Salted popcorn and pretzels. The items listed above may not be a complete list of foods and beverages to avoid. Contact your dietitian for more information. WHERE CAN I FIND MORE INFORMATION? National Heart, Lung, and Blood Institute: CablePromo.itwww.nhlbi.nih.gov/health/health-topics/topics/dash/   This information is not intended to replace advice given to you by your health care provider. Make sure you discuss any questions you have with your health care provider.   Document Released: 07/11/2011 Document Revised: 08/12/2014 Document Reviewed: 05/26/2013 Elsevier Interactive Patient Education 2016 Elsevier Inc. Pneumococcal Polysaccharide Vaccine: What You Need to Know 1. Why get vaccinated? Vaccination can protect older adults (and some children and younger adults) from pneumococcal disease. Pneumococcal disease is caused by bacteria that can spread from person to person through close contact. It can cause ear infections, and it can also lead to more serious infections of the:   Lungs (pneumonia),  Blood (bacteremia), and  Covering of the brain and spinal cord (meningitis). Meningitis can cause deafness and brain damage, and it can be fatal. Anyone can get pneumococcal disease, but children under 712 years of age, people with certain medical conditions, adults over 38 years of age, and cigarette smokers are at the highest risk. About 18,000 older adults  die each year from pneumococcal disease in the [place]. Treatment of pneumococcal infections with penicillin and other drugs used to be more  effective. But some strains of the disease have become resistant to these drugs. This makes prevention of the disease, through vaccination, even more important. 2. Pneumococcal polysaccharide vaccine (PPSV23) Pneumococcal polysaccharide vaccine (PPSV23) protects against 23 types of pneumococcal bacteria. It will not prevent all pneumococcal disease. PPSV23 is recommended for:  All adults 74 years of age and older,  Anyone 2 through 38 years of age with certain long-term health problems,  Anyone 2 through 38 years of age with a weakened immune system,  Adults 63 through 38 years of age who smoke cigarettes or have asthma. Most people need only one dose of PPSV. A second dose is recommended for certain high-risk groups. People 39 and older should get a dose even if they have gotten one or more doses of the vaccine before they turned 65. Your healthcare provider can give you more information about these recommendations. Most healthy adults develop protection within 2 to 3 weeks of getting the shot. 3. Some people should not get this vaccine  Anyone who has had a life-threatening allergic reaction to PPSV should not get another dose.  Anyone who has a severe allergy to any component of PPSV should not receive it. Tell your provider if you have any severe allergies.  Anyone who is moderately or severely ill when the shot is scheduled may be asked to wait until they recover before getting the vaccine. Someone with a mild illness can usually be vaccinated.  Children less than 33 years of age should not receive this vaccine.  There is no evidence that PPSV is harmful to either a pregnant woman or to her fetus. However, as a precaution, women who need the vaccine should be vaccinated before becoming pregnant, if possible. 4. Risks of a vaccine reaction With any medicine, including vaccines, there is a chance of side effects. These are usually mild and go away on their own, but serious reactions are  also possible. About half of people who get PPSV have mild side effects, such as redness or pain where the shot is given, which go away within about two days. Less than 1 out of 100 people develop a fever, muscle aches, or more severe local reactions. Problems that could happen after any vaccine:  People sometimes faint after a medical procedure, including vaccination. Sitting or lying down for about 15 minutes can help prevent fainting, and injuries caused by a fall. Tell your doctor if you feel dizzy, or have vision changes or ringing in the ears.  Some people get severe pain in the shoulder and have difficulty moving the arm where a shot was given. This happens very rarely.  Any medication can cause a severe allergic reaction. Such reactions from a vaccine are very rare, estimated at about 1 in a million doses, and would happen within a few minutes to a few hours after the vaccination. As with any medicine, there is a very remote chance of a vaccine causing a serious injury or death. The safety of vaccines is always being monitored. For more information, visit: http://floyd.org/ 5. What if there is a serious reaction? What should I look for? Look for anything that concerns you, such as signs of a severe allergic reaction, very high fever, or unusual behavior.  Signs of a severe allergic reaction can include hives, swelling of the face and throat, difficulty breathing, a fast  heartbeat, dizziness, and weakness. These would usually start a few minutes to a few hours after the vaccination. What should I do? If you think it is a severe allergic reaction or other emergency that can't wait, call 9-1-1 or get to the nearest hospital. Otherwise, call your doctor. Afterward, the reaction should be reported to the Vaccine Adverse Event Reporting System (VAERS). Your doctor might file this report, or you can do it yourself through the VAERS web site at www.vaers.LAgents.nohhs.gov, or by calling  1-(307) 061-2094.  VAERS does not give medical advice. 6. How can I learn more?  Ask your doctor. He or she can give you the vaccine package insert or suggest other sources of information.  Call your local or state health department.  Contact the Centers for Disease Control and Prevention (CDC):  Call 747-467-03781-(509)305-6875 (1-800-CDC-INFO) or  Visit CDC's website at PicCapture.uywww.cdc.gov/vaccines CDC Pneumococcal Polysaccharide Vaccine VIS (11/26/13)   This information is not intended to replace advice given to you by your health care provider. Make sure you discuss any questions you have with your health care provider.   Document Released: 05/19/2006 Document Revised: 08/12/2014 Document Reviewed: 11/29/2013 Elsevier Interactive Patient Education [employer]2016 Elsevier Inc.

## 2016-06-04 NOTE — Progress Notes (Signed)
LCSWA introduced self and explained [facility] at [facility]. LCSWA disclosed that she received a consult from Dr. Julien NordmannLangeland to provide community resources, if needed.  Pt. reported that he has been experiencing anxiety. Pt stated that he copes with symptoms by smoking cigarettes and drinking beer daily (approx. 6 pack a day). Pt disclosed that he participates in the [facility] program which provides services to those in the justice system. Pt [facility] He further reported that [facility]. Pt denied need for any resources at present time.   LCSWA commended pt on willingness to participate in services. Provided pt with crisis resources and encouraged him to reach out if any additional services are needed.   Jenel LucksJasmine Montana Bryngelson, MSW, LCSWA Clinical Social Worker 06/04/16 5:49 pm

## 2016-06-04 NOTE — Progress Notes (Signed)
Pt is in the office today for establish care Pt states he is not in any pain Pt states he would like the hepatitis C screening

## 2016-06-05 LAB — HEPATITIS C RNA QUANTITATIVE
HCV QUANT LOG: 6.05 {Log} — AB (ref <1.18)
HCV Quantitative: 1133546 IU/mL — ABNORMAL HIGH (ref <15)

## 2016-06-05 LAB — ACUTE HEP PANEL AND HEP B SURFACE AB
HCV Ab: REACTIVE — AB
HEP B C IGM: NONREACTIVE
HEP B S AB: NEGATIVE
HEP B S AG: NEGATIVE
Hep A IgM: NONREACTIVE

## 2016-06-08 ENCOUNTER — Other Ambulatory Visit: Payer: Self-pay | Admitting: Internal Medicine

## 2016-06-08 DIAGNOSIS — B182 Chronic viral hepatitis C: Secondary | ICD-10-CM

## 2016-06-11 ENCOUNTER — Emergency Department (HOSPITAL_COMMUNITY)
Admission: EM | Admit: 2016-06-11 | Discharge: 2016-06-12 | Disposition: A | Discharge status: Home / Self Care | Payer: Self-pay | Attending: Emergency Medicine | Admitting: Emergency Medicine

## 2016-06-11 ENCOUNTER — Encounter (HOSPITAL_COMMUNITY): Payer: Self-pay | Admitting: Emergency Medicine

## 2016-06-11 DIAGNOSIS — F17200 Nicotine dependence, unspecified, uncomplicated: Secondary | ICD-10-CM | POA: Insufficient documentation

## 2016-06-11 DIAGNOSIS — Z7982 Long term (current) use of aspirin: Secondary | ICD-10-CM | POA: Insufficient documentation

## 2016-06-11 DIAGNOSIS — F1010 Alcohol abuse, uncomplicated: Secondary | ICD-10-CM | POA: Insufficient documentation

## 2016-06-11 DIAGNOSIS — T40604A Poisoning by unspecified narcotics, undetermined, initial encounter: Secondary | ICD-10-CM

## 2016-06-11 DIAGNOSIS — T401X4A Poisoning by heroin, undetermined, initial encounter: Secondary | ICD-10-CM | POA: Insufficient documentation

## 2016-06-11 NOTE — Discharge Instructions (Signed)
Avoid all street drugs and alcohol.

## 2016-06-11 NOTE — ED Notes (Addendum)
Per Dr.Cook pt is to be monitored for the next hour then discharged if no change in status. Pt alert and oriented x 4 requesting ice chips at this time.

## 2016-06-11 NOTE — ED Notes (Signed)
Pt in the bed taking all the monitor leads off and blood pressure cuff

## 2016-06-11 NOTE — ED Notes (Signed)
Dr.Cook at bedside to evaluate pt. Pt laying in bed eyes closed equal chest rise and fall no acute distress.

## 2016-06-11 NOTE — ED Notes (Signed)
Bed: RESB Expected date:  Expected time:  Means of arrival:  Comments: overdose 

## 2016-06-11 NOTE — ED Triage Notes (Signed)
Per EMS pt was found by friend in hotel room with decreased respiratory rate. IV access started by EMS and 0.5 mg Narcan administered IVP and pt became responsive and then removed IV access. EMS stated that pt had been using opiates and ETOH tonight. Pt currently alert and oriented at this time.

## 2016-06-11 NOTE — ED Triage Notes (Signed)
Pt reports to using Heroine and ETOH tonight.

## 2016-06-12 ENCOUNTER — Telehealth: Payer: Self-pay

## 2016-06-12 ENCOUNTER — Encounter: Payer: Self-pay | Admitting: Internal Medicine

## 2016-06-12 DIAGNOSIS — F1010 Alcohol abuse, uncomplicated: Secondary | ICD-10-CM | POA: Insufficient documentation

## 2016-06-12 DIAGNOSIS — F1910 Other psychoactive substance abuse, uncomplicated: Secondary | ICD-10-CM | POA: Insufficient documentation

## 2016-06-12 DIAGNOSIS — B1920 Unspecified viral hepatitis C without hepatic coma: Secondary | ICD-10-CM | POA: Insufficient documentation

## 2016-06-12 DIAGNOSIS — F1190 Opioid use, unspecified, uncomplicated: Secondary | ICD-10-CM | POA: Insufficient documentation

## 2016-06-12 NOTE — ED Provider Notes (Signed)
[facility]-EMERGENCY DEPT Provider Note   CSN: [id number] Arrival date & time: 06/11/16  2130     History   Chief Complaint Chief Complaint  Patient presents with  . Drug Overdose    HPI Louanne Beltonicholas Prater is a 40 y.o. male.  Level 5 caveat for unruly behavior.  History obtained from patient. Patient drank alcohol and snorted heroin tonight. EMS administered 0.5 mg of Narcan which helped immensely. Patient awoke immediately. In the emergency department he was talkative and neurologically intact.      History reviewed. No pertinent past medical history.  Patient Active Problem List   Diagnosis Date Noted  . Substance addiction (HCC) 06/18/2011    History reviewed. No pertinent surgical history.     Home Medications    Prior to Admission medications   Medication Sig Start Date End Date Taking? Authorizing Provider  aspirin 81 MG chewable tablet Chew 1 tablet (81 mg total) by mouth daily. Patient not taking: Reported on 06/04/2016 11/16/15   Antony MaduraKelly Humes, PA-C  clindamycin (CLEOCIN) 150 MG capsule Take 2 capsules (300 mg total) by mouth every 6 (six) hours. Patient not taking: Reported on 06/04/2016 11/16/15   Elpidio AnisShari Upstill, PA-C    Family History History reviewed. No pertinent family history.  Social History Social History  Substance Use Topics  . Smoking status: Current Every Day Smoker    Packs/day: 1.00  . Smokeless tobacco: Never Used  . Alcohol use Yes     Comment: beer 12 pack per day; liquor pint/day     Allergies   Patient has no known allergies.   Review of Systems Review of Systems   Physical Exam Updated Vital Signs BP 116/61 (BP Location: Left Arm)   Pulse 109   Temp 97 F (36.1 C) (Oral)   Resp 22   Ht 5\' 11"  (1.803 m)   Wt 175 lb (79.4 kg)   SpO2 97%   BMI 24.41 kg/m   Physical Exam  Constitutional: He is oriented to person, place, and time.  Talkative, unruly, no neuro deficits  HENT:  Head: Normocephalic and atraumatic.  Eyes:  Conjunctivae are normal.  Neck: Neck supple.  Cardiovascular: Normal rate and regular rhythm.   Pulmonary/Chest: Effort normal and breath sounds normal.  Abdominal: Soft. Bowel sounds are normal.  Musculoskeletal: Normal range of motion.  Neurological: He is alert and oriented to person, place, and time.  Skin: Skin is warm and dry.  Psychiatric: He has a normal mood and affect. His behavior is normal.  Nursing note and vitals reviewed.    ED Treatments / Results  Labs (all labs ordered are listed, but only abnormal results are displayed) Labs Reviewed - No data to display  EKG  EKG Interpretation  Date/Time:  Tuesday June 11 2016 21:54:16 EST Ventricular Rate:  98 PR Interval:    QRS Duration: 88 QT Interval:  348 QTC Calculation: 445 R Axis:   21 Text Interpretation:  Sinus rhythm Low voltage, precordial leads RSR' in V1 or V2, probably normal variant Baseline wander in lead(s) V1 Confirmed by Wilkie AyeHORTON  MD, Toni AmendOURTNEY ([id number]) on 06/11/2016 11:58:23 PM       Radiology No results found.  Procedures Procedures (including critical care time)  Medications Ordered in ED Medications - No data to display   Initial Impression / Assessment and Plan / ED Course  I have reviewed the triage vital signs and the nursing notes.  Pertinent labs & imaging results that were available during my care of the patient  were reviewed by me and considered in my medical decision making (see chart for details).  Clinical Course     Patient drank alcohol and used heroin tonight. He responded well to Narcan.  0.4 mg.  He is now sleeping.  Disc with Dr Wilkie AyeHorton.  I have discussed with the RN not to discharge the patient until he is ambulatory.  Final Clinical Impressions(s) / ED Diagnoses   Final diagnoses:  Opiate overdose, undetermined intent, initial encounter  Alcohol abuse    New Prescriptions New Prescriptions   No medications on file     Donnetta HutchingBrian Javonte Elenes, MD 06/12/16 0004

## 2016-06-12 NOTE — Telephone Encounter (Signed)
Contacted pt to go over lab results pt didn't answer lvm asking pt to give me a call back at his earliest convenience  

## 2016-06-12 NOTE — ED Notes (Signed)
Pt friend at bedside to visit with pt. Pt currently alert and oriented x 4 at this time

## 2016-06-12 NOTE — Telephone Encounter (Signed)
Contacted pt mother to make aware of ultrasound appointment was unable to lvm will be sending letter out in the mail with lab results

## 2016-06-12 NOTE — ED Notes (Signed)
Pt ambulated to restroom without any difficulty. Pt alert and oriented x 4 and friend will be able to transport pt home.

## 2016-06-17 ENCOUNTER — Telehealth: Payer: Self-pay | Admitting: Internal Medicine

## 2016-06-17 NOTE — Telephone Encounter (Signed)
Patient called to request lab results °Please follow up °

## 2016-06-18 NOTE — Telephone Encounter (Signed)
Returned pt call to go over lab results pt didn't answer lvm asking pt to give me a call back at his earliest convenience

## 2016-06-20 ENCOUNTER — Ambulatory Visit (HOSPITAL_COMMUNITY): Admission: RE | Admit: 2016-06-20 | Payer: Self-pay | Source: Ambulatory Visit

## 2016-06-24 NOTE — Telephone Encounter (Signed)
Patient called nurse back regarding lab results. Pt stated that he is currently in a detox center and therefore to please call him at the center [phone] (ask for him). Do not call numbers on file. Please follow up.  Thank you.

## 2016-06-25 ENCOUNTER — Telehealth: Payer: Self-pay | Admitting: Internal Medicine

## 2016-06-25 NOTE — Telephone Encounter (Signed)
Pt is aware of results. 

## 2016-06-25 NOTE — Telephone Encounter (Signed)
Pt is aware of results and pt reschedule us for July 02 2016 @830 

## 2016-06-25 NOTE — Telephone Encounter (Signed)
Patient called again this morning to speak with nurse. Pt stated that he has his cell phone again and therefore can be reach there [phone]). Informed pt that nurse has been seeing patient of yesterday and will do the same today. Reminded him of the wait time.  Pt was upset and stated that it's very important that he receive a call today. Didn't want to disclose the information to me. Please follow up.  Thank you.

## 2016-07-02 ENCOUNTER — Ambulatory Visit (HOSPITAL_COMMUNITY): Admission: RE | Admit: 2016-07-02 | Payer: Self-pay | Source: Ambulatory Visit

## 2016-08-21 ENCOUNTER — Ambulatory Visit: Payer: Self-pay

## 2016-09-02 ENCOUNTER — Ambulatory Visit: Payer: Self-pay | Attending: Internal Medicine

## 2016-10-26 ENCOUNTER — Emergency Department (HOSPITAL_COMMUNITY)
Admission: EM | Admit: 2016-10-26 | Discharge: 2016-10-26 | Disposition: A | Discharge status: Home / Self Care | Payer: No Typology Code available for payment source | Attending: Emergency Medicine | Admitting: Emergency Medicine

## 2016-10-26 ENCOUNTER — Encounter (HOSPITAL_COMMUNITY): Payer: Self-pay | Admitting: Nurse Practitioner

## 2016-10-26 ENCOUNTER — Emergency Department (HOSPITAL_COMMUNITY): Payer: No Typology Code available for payment source

## 2016-10-26 DIAGNOSIS — S90512A Abrasion, left ankle, initial encounter: Secondary | ICD-10-CM | POA: Diagnosis not present

## 2016-10-26 DIAGNOSIS — F17200 Nicotine dependence, unspecified, uncomplicated: Secondary | ICD-10-CM | POA: Diagnosis not present

## 2016-10-26 DIAGNOSIS — Y999 Unspecified external cause status: Secondary | ICD-10-CM | POA: Diagnosis not present

## 2016-10-26 DIAGNOSIS — Y939 Activity, unspecified: Secondary | ICD-10-CM | POA: Diagnosis not present

## 2016-10-26 DIAGNOSIS — Y92410 Unspecified street and highway as the place of occurrence of the external cause: Secondary | ICD-10-CM | POA: Diagnosis not present

## 2016-10-26 DIAGNOSIS — T07XXXA Unspecified multiple injuries, initial encounter: Secondary | ICD-10-CM

## 2016-10-26 DIAGNOSIS — S99912A Unspecified injury of left ankle, initial encounter: Secondary | ICD-10-CM | POA: Diagnosis present

## 2016-10-26 DIAGNOSIS — M7918 Myalgia, other site: Secondary | ICD-10-CM

## 2016-10-26 DIAGNOSIS — M25551 Pain in right hip: Secondary | ICD-10-CM | POA: Insufficient documentation

## 2016-10-26 DIAGNOSIS — Z7982 Long term (current) use of aspirin: Secondary | ICD-10-CM | POA: Insufficient documentation

## 2016-10-26 MED ORDER — METHOCARBAMOL 500 MG PO TABS: 500.0000 mg | ORAL_TABLET | Freq: Two times a day (BID) | ORAL | 0 refills | Status: DC

## 2016-10-26 MED ORDER — IBUPROFEN 600 MG PO TABS: 600.0000 mg | ORAL_TABLET | Freq: Four times a day (QID) | ORAL | 0 refills | Status: DC | PRN

## 2016-10-26 MED ORDER — OXYCODONE-ACETAMINOPHEN 5-325 MG PO TABS
1.0000 | ORAL_TABLET | Freq: Once | ORAL | Status: AC
  Administered 2016-10-26: 1 via ORAL
  Filled 2016-10-26: qty 1

## 2016-10-26 MED ORDER — IBUPROFEN 400 MG PO TABS
600.0000 mg | ORAL_TABLET | Freq: Once | ORAL | Status: AC
  Administered 2016-10-26: 600 mg via ORAL
  Filled 2016-10-26: qty 1

## 2016-10-26 NOTE — Progress Notes (Signed)
Orthopedic Tech Progress Note Patient Details:  Kevin Parker 03-Feb-1976 [id number]  Ortho Devices Type of Ortho Device: ASO Ortho Device/Splint Location: Applied ASO (Velcro Ankle Splint) to pt Left Foot.  Trained pt on application and removable of device.  pt tolerated well.    Note:  Pt had probation ankle monitor on left foot black in color. Ortho Device/Splint Interventions: Application, Adjustment   Alvina ChouWilliams, Elen Acero C 10/26/2016, 7:49 PM
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
## 2016-10-26 NOTE — ED Notes (Signed)
Pt stable, ambulatory, states understanding of discharge instructions 
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
## 2016-12-19 ENCOUNTER — Encounter: Payer: Self-pay | Admitting: Internal Medicine

## 2016-12-20 ENCOUNTER — Encounter: Payer: Self-pay | Admitting: Internal Medicine

## 2016-12-23 ENCOUNTER — Encounter: Payer: Self-pay | Admitting: Internal Medicine

## 2017-01-08 ENCOUNTER — Emergency Department (HOSPITAL_COMMUNITY)
Admission: EM | Admit: 2017-01-08 | Discharge: 2017-01-08 | Discharge status: Absent without leave | Payer: No Typology Code available for payment source

## 2017-01-09 ENCOUNTER — Emergency Department (HOSPITAL_BASED_OUTPATIENT_CLINIC_OR_DEPARTMENT_OTHER)
Admission: EM | Admit: 2017-01-09 | Discharge: 2017-01-09 | Disposition: A | Discharge status: Home / Self Care | Payer: Self-pay | Attending: Emergency Medicine | Admitting: Emergency Medicine

## 2017-01-09 ENCOUNTER — Encounter (HOSPITAL_BASED_OUTPATIENT_CLINIC_OR_DEPARTMENT_OTHER): Payer: Self-pay

## 2017-01-09 DIAGNOSIS — F17200 Nicotine dependence, unspecified, uncomplicated: Secondary | ICD-10-CM | POA: Insufficient documentation

## 2017-01-09 DIAGNOSIS — L02413 Cutaneous abscess of right upper limb: Secondary | ICD-10-CM | POA: Insufficient documentation

## 2017-01-09 DIAGNOSIS — L03113 Cellulitis of right upper limb: Secondary | ICD-10-CM | POA: Insufficient documentation

## 2017-01-09 HISTORY — DX: Other psychoactive substance use, unspecified, uncomplicated: F19.90

## 2017-01-09 MED ORDER — CEPHALEXIN 500 MG PO CAPS: ORAL_CAPSULE | ORAL | 0 refills | Status: DC

## 2017-01-09 MED ORDER — LIDOCAINE-EPINEPHRINE 2 %-1:100000 IJ SOLN
20.0000 mL | Freq: Once | INTRAMUSCULAR | Status: AC
  Administered 2017-01-09: 20 mL
  Filled 2017-01-09: qty 1

## 2017-01-09 MED ORDER — SULFAMETHOXAZOLE-TRIMETHOPRIM 800-160 MG PO TABS: 1.0000 | ORAL_TABLET | Freq: Two times a day (BID) | ORAL | 0 refills | Status: DC

## 2017-01-09 MED ORDER — IBUPROFEN 600 MG PO TABS: 600.0000 mg | ORAL_TABLET | Freq: Four times a day (QID) | ORAL | 0 refills | Status: DC | PRN

## 2017-01-09 NOTE — ED Triage Notes (Signed)
Pt c/o ?insect bite to right FA x 4-5 days-denies IV drug use at site-large amount of redness noted-small round red area to left AC that pt admits to IV drug use x 1 week ago-NAD-steady gait-[facility] but left after being advised of wait

## 2017-01-09 NOTE — ED Provider Notes (Signed)
[facility]   CSN: [id number] Arrival date & time: 01/09/17  1537  By signing my name below, I, Kevin Parker, attest that this documentation has been prepared under the direction and in the presence of non-physician practitioner, Fayrene Helper, PA-C. Electronically Signed: Modena Parker, Scribe. 01/09/2017. 5:43 PM.  History   Chief Complaint Chief Complaint  Patient presents with  . Arm Problem   The history is provided by the patient. No language interpreter was used.   HPI Comments: Kevin Parker is a 41 y.o. male who presents to the Emergency Department complaining of constant moderate RUE rash that started about 8 days ago. He states he may have been bitten by an insect on his right forearm. He took someone else's amoxicillin PTA without relief. He describes the rash as red, painful, and itchy. His tetanus is UTD. He is both right and left hand dominant. Denies any recent IV drug use on his RUE, fever, chills, numbness, or other complaints at this time. Does have hx of IV drug use and did relapse a week ago.  Injection site to L antecubital.  Denies any associated pain to IV site.    Past Medical History:  Diagnosis Date  . IV drug user     Patient Active Problem List   Diagnosis Date Noted  . Heroin use 06/12/2016  . Polysubstance abuse 06/12/2016  . Hepatitis C virus infection 06/12/2016  . ETOH abuse 06/12/2016  . Substance addiction (HCC) 06/18/2011    Past Surgical History:  Procedure Laterality Date  . APPENDECTOMY         Home Medications    Prior to Admission medications   Not on File    Family History No family history on file.  Social History Social History  Substance Use Topics  . Smoking status: Current Every Day Smoker    Packs/day: 1.00  . Smokeless tobacco: Never Used  . Alcohol use Yes     Comment: weekly     Allergies   Patient has no known allergies.   Review of Systems Review of Systems  Constitutional:  Negative for chills and fever.  Musculoskeletal: Positive for myalgias.  Skin: Positive for color change and rash.  Neurological: Negative for numbness.  All other systems reviewed and are negative.    Physical Exam Updated Vital Signs BP (!) 143/91 (BP Location: Left Arm)   Pulse 80   Temp 98.5 F (36.9 C) (Oral)   Resp 18   Ht 5\' 11"  (1.803 m)   Wt 181 lb (82.1 kg)   SpO2 100%   BMI 25.24 kg/m   Physical Exam  Constitutional: He appears well-developed and well-nourished. No distress.  HENT:  Head: Normocephalic and atraumatic.  Eyes: Conjunctivae are normal.  Neck: Neck supple.  Cardiovascular: Normal rate.   Pulmonary/Chest: Effort normal.  Abdominal: Soft.  Musculoskeletal: Normal range of motion.  Neurological: He is alert.  Skin: Skin is warm and dry.  Right forearm: Area of induration and fluctuance noted to the volar aspect of the medial mid forearm measuring about 3x 4 cm with surrounding erythema extending towards the elbow.   Psychiatric: He has a normal mood and affect.  Nursing note and vitals reviewed.    ED Treatments / Results  DIAGNOSTIC STUDIES: Oxygen Saturation is 100% on RA, normal by my interpretation.    COORDINATION OF CARE: 5:47 PM- Pt advised of plan for treatment, which includes incision and drainage, and pt agrees.  Labs (all labs ordered are  listed, but only abnormal results are displayed) Labs Reviewed - No data to display  EKG  EKG Interpretation None       Radiology No results found.  Procedures Procedures (including critical care time)  INCISION AND DRAINAGE Performed by: Fayrene HelperRAN,Jamez Ambrocio Consent: Verbal consent obtained. Risks and benefits: risks, benefits and alternatives were discussed Type: abscess  Body area: R forearm  Anesthesia: local infiltration  Incision was made with a scalpel.  Local anesthetic: lidocaine 2% w epinephrine  Anesthetic total: 3 ml  Complexity: complex Blunt dissection to break up  loculations  Drainage: purulent  Drainage amount: moderate  Packing material: 1/2 in iodoform gauze  Patient tolerance: Patient tolerated the procedure well with no immediate complications.     Medications Ordered in ED Medications  lidocaine-EPINEPHrine (XYLOCAINE W/EPI) 2 %-1:100000 (with pres) injection 20 mL (not administered)     Initial Impression / Assessment and Plan / ED Course  I have reviewed the triage vital signs and the nursing notes.  Pertinent labs & imaging results that were available during my care of the patient were reviewed by me and considered in my medical decision making (see chart for details).     BP 131/86 (BP Location: Right Arm)   Pulse 77   Temp 98.5 F (36.9 C) (Oral)   Resp 16   Ht 5\' 11"  (1.803 m)   Wt 82.1 kg (181 lb)   SpO2 100%   BMI 25.24 kg/m    Final Clinical Impressions(s) / ED Diagnoses   Final diagnoses:  Abscess of forearm, right  Right forearm cellulitis    New Prescriptions New Prescriptions   CEPHALEXIN (KEFLEX) 500 MG CAPSULE    2 caps po bid x 7 days   IBUPROFEN (ADVIL,MOTRIN) 600 MG TABLET    Take 1 tablet (600 mg total) by mouth every 6 (six) hours as needed.   SULFAMETHOXAZOLE-TRIMETHOPRIM (BACTRIM DS,SEPTRA DS) 800-160 MG TABLET    Take 1 tablet by mouth 2 (two) times daily.   I personally performed the services described in this documentation, which was scribed in my presence. The recorded information has been reviewed and is accurate.   Pt here with abscess/cellulitis involving the R forearm, non circumferential.  Likely from insect bite and less likely IVDU.  No systemic infection.  Succesful I&D with moderate pustular discharge.  Pt d/c with keflex/bactrim.  Return in 48 hrs for wound recheck and packing removal.      Fayrene Helperran, Chaniya Genter, PA-C 01/09/17 Helene [id number]    Zackowski, Scott, MD 01/11/17 [phone]

## 2017-01-09 NOTE — Discharge Instructions (Signed)
Return in 48 hrs for wound recheck and packing removal.  Take antibiotics as prescribed for the full duration.  Use warm compress several times daily too help with healing.

## 2017-01-09 NOTE — ED Notes (Signed)
Borders of wound marked with marker

## 2017-01-11 ENCOUNTER — Encounter (HOSPITAL_COMMUNITY): Payer: Self-pay

## 2017-01-11 ENCOUNTER — Emergency Department (HOSPITAL_COMMUNITY)
Admission: EM | Admit: 2017-01-11 | Discharge: 2017-01-11 | Disposition: A | Discharge status: Home / Self Care | Payer: Self-pay | Attending: Emergency Medicine | Admitting: Emergency Medicine

## 2017-01-11 DIAGNOSIS — F17200 Nicotine dependence, unspecified, uncomplicated: Secondary | ICD-10-CM | POA: Insufficient documentation

## 2017-01-11 DIAGNOSIS — Z5189 Encounter for other specified aftercare: Secondary | ICD-10-CM

## 2017-01-11 DIAGNOSIS — Z4800 Encounter for change or removal of nonsurgical wound dressing: Secondary | ICD-10-CM | POA: Insufficient documentation

## 2017-01-11 DIAGNOSIS — L03012 Cellulitis of left finger: Secondary | ICD-10-CM

## 2017-01-11 DIAGNOSIS — L02414 Cutaneous abscess of left upper limb: Secondary | ICD-10-CM

## 2017-01-11 MED ORDER — LIDOCAINE HCL (PF) 1 % IJ SOLN
5.0000 mL | Freq: Once | INTRAMUSCULAR | Status: AC
  Administered 2017-01-11: 5 mL
  Filled 2017-01-11: qty 30

## 2017-01-11 MED ORDER — SULFAMETHOXAZOLE-TRIMETHOPRIM 800-160 MG PO TABS: 1.0000 | ORAL_TABLET | Freq: Two times a day (BID) | ORAL | 0 refills | Status: DC

## 2017-01-11 MED ORDER — CEPHALEXIN 500 MG PO CAPS: ORAL_CAPSULE | ORAL | 0 refills | Status: DC

## 2017-01-11 NOTE — ED Provider Notes (Signed)
[facility]-EMERGENCY DEPT Provider Note   CSN: [id number] Arrival date & time: 01/11/17  1149   By signing my name below, I, Clarisse Gouge, attest that this documentation has been prepared under the direction and in the presence of [facility], PA-C. Electronically Signed: Clarisse Gouge, Scribe. 01/11/17. 12:39 PM.   History   Chief Complaint Chief Complaint  Patient presents with  . Wound Check   The history is provided by the patient and medical records. No language interpreter was used.    Kevin Parker is a 41 y.o. male with h/o IV drug use, hepatitis C and polysubstance abuse presenting to the Emergency Department for recheck of a wound on the R forearm first incised 01/09/2017. He notes a small, rased, indurated bump close to the site of his presenting incision; and he reports another small, raised, pointed, indurated bump with redness surrounding on the anterior L elbow. He acknowledges need for I&D of bump to L elbow; he states he would not like treatment for this today. Pt had an I&D performed on an abscess to the R forearm on the aforementioned date and was then discharged with keflex and bactrim prescriptions, per the pt and records. He notes no complications in the time since his procedure. Pt reportedly ambidextrous. He denies fevers, chills, weakness or numbness currently.  Pt also c/o a small, painful, raised bump to the L middle finger near the nailbed. He states he bites his fingernails habitually. No other complaints at this time.   Past Medical History:  Diagnosis Date  . IV drug user     Patient Active Problem List   Diagnosis Date Noted  . Heroin use 06/12/2016  . Polysubstance abuse 06/12/2016  . Hepatitis C virus infection 06/12/2016  . ETOH abuse 06/12/2016  . Substance addiction (HCC) 06/18/2011    Past Surgical History:  Procedure Laterality Date  . APPENDECTOMY         Home Medications    Prior to Admission medications   Medication Sig Start Date End  Date Taking? Authorizing Provider  cephALEXin (KEFLEX) 500 MG capsule 2 caps po bid  (add on to prior prescription) 01/11/17   Trixie Dredge, PA-C  ibuprofen (ADVIL,MOTRIN) 600 MG tablet Take 1 tablet (600 mg total) by mouth every 6 (six) hours as needed. 01/09/17   Fayrene Helper, PA-C  sulfamethoxazole-trimethoprim (BACTRIM DS,SEPTRA DS) 800-160 MG tablet Take 1 tablet by mouth 2 (two) times daily. Continue after finishing prior prescription 01/11/17   Trixie Dredge, [place]    Family History No family history on file.  Social History Social History  Substance Use Topics  . Smoking status: Current Every Day Smoker    Packs/day: 1.00  . Smokeless tobacco: Never Used  . Alcohol use Yes     Comment: weekly     Allergies   Patient has no known allergies.   Review of Systems Review of Systems  Constitutional: Negative for chills and fever.  Musculoskeletal: Negative for arthralgias.  Skin: Positive for color change and wound.  Neurological: Negative for weakness and numbness.  Psychiatric/Behavioral: Negative for self-injury.  All other systems reviewed and are negative.    Physical Exam Updated Vital Signs BP (!) 124/57 (BP Location: Right Arm)   Pulse 83   Temp 98 F (36.7 C) (Oral)   Resp 16   SpO2 97%   Physical Exam  Constitutional: He appears well-developed and well-nourished.  HENT:  Head: Normocephalic and atraumatic.  Neck: Neck supple.  Pulmonary/Chest: Effort normal.  Neurological: He  is alert.  Skin: Laceration noted. There is erythema.  R forearm abscess packed. With packing removed base is clean. Mild erythema surrounding the incision. Erythema far inside several centimeters within drawn line.  L third finger with paronychia on the radial side. Full active ROM of the finger. Sensation intact. Capillary refill < 2 seconds. L AC with pointed, pustular head and surrounding erythema. FROM of the L elbow.  Nursing note and vitals reviewed.    ED Treatments / Results    DIAGNOSTIC STUDIES: Oxygen Saturation is 97% on RA, NL by my interpretation.    COORDINATION OF CARE: 12:24 PM-Discussed next steps with pt. Pt verbalized understanding and is agreeable with the plan. Pt prepared for I&D.   Labs (all labs ordered are listed, but only abnormal results are displayed) Labs Reviewed - No data to display  EKG  EKG Interpretation None       Radiology No results found.  Procedures Procedures (including critical care time)  INCISION AND DRAINAGE Performed by: Trixie DredgeWEST, Salene Mohamud Consent: Verbal consent obtained. Risks and benefits: risks, benefits and alternatives were discussed Type: abscess  Body area: 3rd finger paronychia   Anesthesia: digital block  Incision was made with a scalpel.  Local anesthetic: lidocaine 1% no epinephrine  Anesthetic total: 6 ml  Complexity: complex Blunt dissection to break up loculations  Drainage: purulent  Drainage amount: moderate  Packing material: none  Flushed with normal saline  Patient tolerance: Patient tolerated the procedure well with no immediate complications.     Medications Ordered in ED Medications  lidocaine (PF) (XYLOCAINE) 1 % injection 5 mL (5 mLs Infiltration Given 01/11/17 1309)     Initial Impression / Assessment and Plan / ED Course  I have reviewed the triage vital signs and the nursing notes.  Pertinent labs & imaging results that were available during my care of the patient were reviewed by me and considered in my medical decision making (see chart for details).     Afebrile, nontoxic patient with improving right forearm abscess and cellulitis following I&D two days ago.  Now notes paronychia on left hand and also forming abscess just distal and lateral to left AC.  I have advised him that this does need to be drained - he acknowledged this but would like to try warm soaks and compresses at home with antibiotics first.  I&D of paronychia in ED.   D/C home with extension of  keflex, bactrim, PCP follow up, ED for worsening symptoms.  Discussed result, findings, treatment, and follow up  with patient.  Pt given return precautions.  Pt verbalizes understanding and agrees with plan.       Final Clinical Impressions(s) / ED Diagnoses   Final diagnoses:  Paronychia of finger of left hand  Wound check, abscess  Abscess of left forearm    New Prescriptions Discharge Medication List as of 01/11/2017  1:51 PM      I personally performed the services described in this documentation, which was scribed in my presence. The recorded information has been reviewed and is accurate.    Trixie DredgeWest, Azriella Mattia, PA-C 01/11/17 1427    Vanetta MuldersZackowski, Scott, MD 01/13/17 [phone]

## 2017-01-11 NOTE — ED Notes (Signed)
ED Provider at bedside. 

## 2017-01-11 NOTE — ED Notes (Signed)
Irving BurtonEmily PA given needles and lidocaine for I&D on left finger.

## 2017-01-11 NOTE — Discharge Instructions (Signed)
Read the information below.  Use the prescribed medication as directed.  Please discuss all new medications with your pharmacist.  You may return to the Emergency Department at any time for worsening condition or any new symptoms that concern you.    If you develop increased redness, swelling, worsening pain, or fevers greater than 100.4, return to the ER immediately for a recheck.  Please use warm moist compresses and warm soaks to help your abscesses drain.

## 2017-01-11 NOTE — ED Triage Notes (Signed)
He states a wound at right forearm was drained here two days ago. He was advised to return today for recheck.

## 2019-05-26 ENCOUNTER — Other Ambulatory Visit: Payer: Self-pay

## 2019-05-26 ENCOUNTER — Emergency Department (HOSPITAL_COMMUNITY)
Admission: EM | Admit: 2019-05-26 | Discharge: 2019-05-26 | Disposition: A | Discharge status: Home / Self Care | Payer: Self-pay | Attending: Emergency Medicine | Admitting: Emergency Medicine

## 2019-05-26 ENCOUNTER — Encounter (HOSPITAL_COMMUNITY): Payer: Self-pay

## 2019-05-26 DIAGNOSIS — L02413 Cutaneous abscess of right upper limb: Secondary | ICD-10-CM | POA: Insufficient documentation

## 2019-05-26 DIAGNOSIS — F1190 Opioid use, unspecified, uncomplicated: Secondary | ICD-10-CM

## 2019-05-26 DIAGNOSIS — F17210 Nicotine dependence, cigarettes, uncomplicated: Secondary | ICD-10-CM | POA: Insufficient documentation

## 2019-05-26 DIAGNOSIS — J340 Abscess, furuncle and carbuncle of nose: Secondary | ICD-10-CM | POA: Insufficient documentation

## 2019-05-26 DIAGNOSIS — F1110 Opioid abuse, uncomplicated: Secondary | ICD-10-CM | POA: Insufficient documentation

## 2019-05-26 DIAGNOSIS — Z8614 Personal history of Methicillin resistant Staphylococcus aureus infection: Secondary | ICD-10-CM | POA: Insufficient documentation

## 2019-05-26 LAB — BASIC METABOLIC PANEL
Anion gap: 10 (ref 5–15)
BUN: 27 mg/dL — ABNORMAL HIGH (ref 6–20)
CO2: 27 mmol/L (ref 22–32)
Calcium: 9.1 mg/dL (ref 8.9–10.3)
Chloride: 95 mmol/L — ABNORMAL LOW (ref 98–111)
Creatinine, Ser: 0.87 mg/dL (ref 0.61–1.24)
GFR calc Af Amer: >60 mL/min (ref >60)
GFR calc non Af Amer: >60 mL/min (ref >60)
Glucose, Bld: 125 mg/dL — ABNORMAL HIGH (ref 70–99)
Potassium: 3.9 mmol/L (ref 3.5–5.1)
Sodium: 132 mmol/L — ABNORMAL LOW (ref 135–145)

## 2019-05-26 LAB — CBC WITH DIFFERENTIAL/PLATELET
Abs Immature Granulocytes: 0.04 10*3/uL (ref 0.00–0.07)
Basophils Absolute: 0 10*3/uL (ref 0.0–0.1)
Basophils Relative: 0 %
Eosinophils Absolute: 0 10*3/uL (ref 0.0–0.5)
Eosinophils Relative: 0 %
HCT: 37 % — ABNORMAL LOW (ref 39.0–52.0)
Hemoglobin: 12.2 g/dL — ABNORMAL LOW (ref 13.0–17.0)
Immature Granulocytes: 0 %
Lymphocytes Relative: 13 %
Lymphs Abs: 1.6 10*3/uL (ref 0.7–4.0)
MCH: 31.1 pg (ref 26.0–34.0)
MCHC: 33 g/dL (ref 30.0–36.0)
MCV: 94.4 fL (ref 80.0–100.0)
Monocytes Absolute: 1.1 10*3/uL — ABNORMAL HIGH (ref 0.1–1.0)
Monocytes Relative: 8 %
Neutro Abs: 10 10*3/uL — ABNORMAL HIGH (ref 1.7–7.7)
Neutrophils Relative %: 79 %
Platelets: 294 10*3/uL (ref 150–400)
RBC: 3.92 MIL/uL — ABNORMAL LOW (ref 4.22–5.81)
RDW: 14.3 % (ref 11.5–15.5)
WBC: 12.8 10*3/uL — ABNORMAL HIGH (ref 4.0–10.5)
nRBC: 0 % (ref 0.0–0.2)

## 2019-05-26 LAB — LACTIC ACID, PLASMA: Lactic Acid, Venous: 1.2 mmol/L (ref 0.5–1.9)

## 2019-05-26 MED ORDER — SULFAMETHOXAZOLE-TRIMETHOPRIM 800-160 MG PO TABS
1.0000 | ORAL_TABLET | Freq: Once | ORAL | Status: AC
  Administered 2019-05-26: 17:00:00 1 via ORAL
  Filled 2019-05-26: qty 1

## 2019-05-26 MED ORDER — ACETAMINOPHEN 500 MG PO TABS
1000.0000 mg | ORAL_TABLET | Freq: Once | ORAL | Status: AC
  Administered 2019-05-26: 16:00:00 1000 mg via ORAL
  Filled 2019-05-26: qty 2

## 2019-05-26 MED ORDER — IBUPROFEN 600 MG PO TABS: 600.0000 mg | ORAL_TABLET | Freq: Four times a day (QID) | ORAL | 0 refills | Status: DC | PRN

## 2019-05-26 MED ORDER — LIDOCAINE HCL (PF) 1 % IJ SOLN
5.0000 mL | Freq: Once | INTRAMUSCULAR | Status: AC
  Administered 2019-05-26: 16:00:00 5 mL
  Filled 2019-05-26: qty 30

## 2019-05-26 MED ORDER — SULFAMETHOXAZOLE-TRIMETHOPRIM 800-160 MG PO TABS: 1.0000 | ORAL_TABLET | Freq: Two times a day (BID) | ORAL | 0 refills | Status: AC

## 2019-05-26 MED FILL — SULFAMETHOXAZOLE-TMP DS TAB: 800-160 | 7 days supply | Qty: 14 | Fill #0

## 2019-05-26 MED FILL — IBUPROFEN 600 MG TABLET: 600 | 7 days supply | Qty: 30 | Fill #0

## 2019-05-26 NOTE — ED Notes (Signed)
Edwin Cap, Case Management gave patient prescribed medications.

## 2019-05-26 NOTE — Progress Notes (Signed)
TOC CM spoke to pt at bedside and provided him with his Bactrim and Advil (CM picked up from pharmacy due to pharmacy closing at 6 pm) through [facility] program, pt recently got out of prison and has no insurance or PCP. Pt was referred to [facility]. Will schedule his appt and call with time on 07/26/2019. Explained [place] program with $0 copay and once per year use. [place], [place] ED TOC CM [phone]

## 2019-05-26 NOTE — Discharge Instructions (Signed)
I have spoken to social work Risk manager who will help you afford and pick up your medicines.  I have sent a prescription for Bactrim which is an antibiotic.  Take this until fully completed for the next 7 days.  Apply warm moist heat to nose and elbow frequently throughout the day as much as possible while massaging to help drainage.   For pain and inflammation you can use a combination of ibuprofen and acetaminophen.  Take [phone] mg acetaminophen (tylenol) every 6 hours or 600 mg ibuprofen (advil, motrin) every 6 hours.  You can take these separately or combine them every 6 hours for maximum pain control. Do not exceed 4,000 mg acetaminophen or 2,400 mg ibuprofen in a 24 hour period.  Do not take ibuprofen containing products if you have history of kidney disease, ulcers, GI bleeding, severe acid reflux, or take a blood thinner.  Do not take acetaminophen if you have liver disease.   Return for worsening swelling redness warmth fever greater than 100, severe elbow pain and difficulty moving the joint

## 2019-05-26 NOTE — Progress Notes (Signed)
CSW provided pt with a map and directions/contact information for [facility] and [place] and offered pt a PART bus pass, but pt declined stating he had a friend who could pick the pt and transport the pt to [facility].  Pt was appreciative and thanked the CSW.  CSW will continue to follow for D/C needs.  Kevin Guild. Jenascia Bumpass, LCSW, LCAS, CSI Transitions of Care Clinical Social Worker Care Coordination Department Ph: [phone]

## 2019-05-26 NOTE — Progress Notes (Signed)
Consult request has been received. CSW attempting to follow up at present time.  CSW spoke to EDP who will call the [facility] ED RN CM for medication needs (Bactrim) and CSW will bring pt directions to and a bus pass to the pt (who, per EPD, states he is an IV drug user) for inter-city travel via PART bus to [facility].   Per EPD, pt stated to EDP that the pt's plan is to transport to [facility] tomorrow on 10/22, presumably for detox and/or [facility] will continue to follow for D/C needs.  Alphonse Guild. Naja Apperson, LCSW, LCAS, CSI Transitions of Care Clinical Social Worker Care Coordination Department Ph: [phone]

## 2019-05-26 NOTE — ED Notes (Signed)
Social Work @ bedside

## 2019-05-26 NOTE — ED Notes (Signed)
An After Visit Summary was printed and given to the patient. Discharge instructions given and no further questions at this time. Pt states friend is giving him ride to [facility]

## 2019-05-26 NOTE — ED Triage Notes (Signed)
Pt reports he originally thought he hand an ingrown hair in this nose and plucked it. Pt reports his nose started to progressively swell the following days. Pt also has swollen spot near right elbow. Swelling and redness noted in both locations. 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 Pt states that he recently relapsed and injected heroin in his right wrist. Pt also reports he was recently released from prison and has been staying at a hotel that he says is dirty.

## 2019-05-26 NOTE — ED Notes (Signed)
Pt has erythema to R elbow and red, swollen nose. Pt states elbow is 5/10 pain.

## 2019-05-26 NOTE — ED Provider Notes (Signed)
[facility] DEPT Provider Note   CSN: [id number] Arrival date & time: 05/26/19  1440     History   Chief Complaint Chief Complaint  Patient presents with  . Headache  . Facial Swelling  . Arm Swelling    HPI Kevin Parker is a 43 y.o. male with history of IV drug use including heroin, hepatitis C, EtOH abuse, MRSA here for evaluation of nasal swelling and right elbow swelling.  States he recently got out of jail a week ago.  He had nowhere to go so they placed him in a hotel in [place] where he has been living  [place] that he has been using IV heroin and injecting in the medial aspect of his wrists.  3 days ago he was shaving and plucked a nose hair and over the last couple of days there has been gradual, worsening swelling, redness warmth and exquisite tenderness to the nose tip slightly worse on the right side.  He has a pustule on the posterior right elbow that has associated redness, warmth and minimal pain.  He squeezed the pustule and a small amount of pus came out today.  The redness has been slightly worsening as well.  He has been massaging the area with warm water in the shower.  Reports having history of abscess from IV drug use that required incision and drainage and antibiotics in the past.  He has a generalized frontal headache that he attributes to the nose pain and has felt subjective fevers.  No chills.  He denies snorting any illicit drugs.  No chest pain or shortness of breath.  He has arranged outpatient detox through [place] and he is planning on going tomorrow.     HPI  Past Medical History:  Diagnosis Date  . IV drug user     Patient Active Problem List   Diagnosis Date Noted  . Heroin use 06/12/2016  . Polysubstance abuse ([place]) 06/12/2016  . Hepatitis C virus infection 06/12/2016  . ETOH abuse 06/12/2016  . Substance addiction ([place]) 06/18/2011    Past Surgical History:  Procedure Laterality Date  . APPENDECTOMY          Home Medications    Prior to Admission medications   Medication Sig Start Date End Date Taking? Authorizing Provider  cephALEXin (KEFLEX) 500 MG capsule 2 caps po bid  (add on to prior prescription) Patient not taking: Reported on 05/26/2019 01/11/17   Clayton Bibles, PA-C  ibuprofen (ADVIL) 600 MG tablet Take 1 tablet (600 mg total) by mouth every 6 (six) hours as needed. 05/26/19   Kinnie Feil, PA-C  sulfamethoxazole-trimethoprim (BACTRIM DS) 800-160 MG tablet Take 1 tablet by mouth 2 (two) times daily for 7 days. 05/26/19 06/02/19  Kinnie Feil, PA-C    Family History History reviewed. No pertinent family history.  Social History Social History   Tobacco Use  . Smoking status: Current Every Day Smoker    Packs/day: 1.00  . Smokeless tobacco: Never Used  Substance Use Topics  . Alcohol use: Yes    Comment: weekly  . Drug use: Yes    Types: IV     Allergies   Patient has no known allergies.   Review of Systems Review of Systems  HENT:       Nasal pain redness swelling warmth   Skin: Positive for color change and wound.  All other systems reviewed and are negative.    Physical Exam Updated Vital Signs BP (!) 144/108 (BP  Location: Right Arm)   Pulse 91   Temp 97.8 F (36.6 C) (Oral)   Resp 16   Ht 5\' 11"  (1.803 m)   Wt 77.1 kg   SpO2 100%   BMI 23.71 kg/m   Physical Exam Vitals signs and nursing note reviewed.  Constitutional:      General: He is not in acute distress.    Appearance: He is well-developed.     Comments: NAD.  HENT:     Head: Normocephalic and atraumatic.     Right Ear: External ear normal.     Left Ear: External ear normal.     Nose: Nasal tenderness present.     Comments: See photo below.  Exquisite tenderness to nose tip and right nare.  Right septum is slightly erythematous, edematous and minimally tender.  Mucous membrane/inner right nare edematous, tender and fluctuant. No drainage or bleeding.  Left septum and nare  normal. No septal perforation. No maxillary/frontal sinus tenderness  Eyes:     General: No scleral icterus.    Conjunctiva/sclera: Conjunctivae normal.  Neck:     Musculoskeletal: Normal range of motion and neck supple.  Cardiovascular:     Rate and Rhythm: Normal rate and regular rhythm.     Heart sounds: Normal heart sounds.     Comments: No murmurs  Pulmonary:     Effort: Pulmonary effort is normal.     Breath sounds: Normal breath sounds.  Musculoskeletal: Normal range of motion.        General: No deformity.     Comments: No focal bony tenderness, edema, fluctuance of the right olecranon process, epicondyles, AC.  Full flexion, extension and supination of the right elbow without any pain.  Skin:    General: Skin is warm and dry.     Capillary Refill: Capillary refill takes less than 2 seconds.     Findings: Abscess and erythema present.     Comments: Pustule with scab below right elbow/olecranon process with surrounding erythema approx 10x12cm, no tenderness to this area. Patient squeezed pustule and scant amount of purulent drainage expressed. See photo.   Neurological:     Mental Status: He is alert and oriented to person, place, and time.  Psychiatric:        Behavior: Behavior normal.        Thought Content: Thought content normal.        Judgment: Judgment normal.          ED Treatments / Results  Labs (all labs ordered are listed, but only abnormal results are displayed) Labs Reviewed  CBC WITH DIFFERENTIAL/PLATELET - Abnormal; Notable for the following components:      Result Value   WBC 12.8 (*)    RBC 3.92 (*)    Hemoglobin 12.2 (*)    HCT 37.0 (*)    Neutro Abs 10.0 (*)    Monocytes Absolute 1.1 (*)    All other components within normal limits  BASIC METABOLIC PANEL - Abnormal; Notable for the following components:   Sodium 132 (*)    Chloride 95 (*)    Glucose, Bld 125 (*)    BUN 27 (*)    All other components within normal limits  LACTIC ACID,  PLASMA  LACTIC ACID, PLASMA    EKG None  Radiology No results found.  Procedures Procedures (including critical care time)  Medications Ordered in ED Medications  sulfamethoxazole-trimethoprim (BACTRIM DS) 800-160 MG per tablet 1 tablet (has no administration in time range)  acetaminophen (  TYLENOL) tablet 1,000 mg (1,000 mg Oral Given 05/26/19 1614)  lidocaine (PF) (XYLOCAINE) 1 % injection 5 mL (5 mLs Infiltration Given 05/26/19 1614)     Initial Impression / Assessment and Plan / ED Course  I have reviewed the triage vital signs and the nursing notes.  Pertinent labs & imaging results that were available during my care of the patient were reviewed by me and considered in my medical decision making (see chart for details).  Clinical Course as of May 26 1719  Wed May 26, 2019  1649 WBC(!): 12.8 [CG]  1653 Lactic Acid, Venous: 1.2 [CG]    Clinical Course User Index [CG] Liberty HandyGibbons, Nhan Qualley J, PA-C   43 year old male with small abscess to the right nare and pustule/early abscess to the right posterior elbow with expanding cellulitis.  History of IV drug use and MRSA.  Afebrile without tachycardia.  Right nare abscess has surrounding induration and cellulitis to the tip of the nose and slightly over the left nare.  No frontal or maxillary sinus or facial tenderness.  Right elbow pustule with cellulitis but no signs of involvement of the nearby joints.  He has full range of motion of the elbow without any pain.  Your work-up reviewed by me shows minimal leukocytosis and normal lactic acid.  Patient was given to Tylenol and Bactrim.  Reassessed no clinical decline.  Given benign exam, lab work I do not think there is indication for IV antibiotics, admission or further work-up here.  He is planning to go to [facility] for heroin detox.  Unable to afford and does not have a ride to pick up his prescription.  Social work consult pending.  1655: I spoke to [place]anda with social work will  call me back to assist patient.   1720: I have spoken to DunlevyJonathan with SW who will meet with patient and give a bus pass for tomorrow morning and instructions on how to get to [facility] [place].  I have spoken to [facility] with case management who will [facility] so patient can pick up his medicines.  Recommended Bactrim, NSAIDs, moist/warm massage.  Erythema in the elbow marked by RN.  Return precautions discussed.  Patient is comfortable with this. Final Clinical Impressions(s) / ED Diagnoses   Final diagnoses:  Nasal abscess  Abscess of right arm  Heroin use    ED Discharge Orders         Ordered    sulfamethoxazole-trimethoprim (BACTRIM DS) 800-160 MG tablet  2 times daily     05/26/19 1717    ibuprofen (ADVIL) 600 MG tablet  Every 6 hours PRN     05/26/19 1717           Liberty HandyGibbons, Kennetha Pearman J, PA-C 05/26/19 1720    Vanetta MuldersZackowski, Scott, MD 06/02/19 [phone]

## 2019-06-01 ENCOUNTER — Encounter (HOSPITAL_COMMUNITY): Payer: Self-pay | Admitting: Emergency Medicine

## 2019-06-01 ENCOUNTER — Emergency Department (HOSPITAL_COMMUNITY)
Admission: EM | Admit: 2019-06-01 | Discharge: 2019-06-01 | Disposition: A | Discharge status: Home / Self Care | Payer: Self-pay | Attending: Emergency Medicine | Admitting: Emergency Medicine

## 2019-06-01 ENCOUNTER — Other Ambulatory Visit: Payer: Self-pay

## 2019-06-01 DIAGNOSIS — L02413 Cutaneous abscess of right upper limb: Secondary | ICD-10-CM | POA: Insufficient documentation

## 2019-06-01 DIAGNOSIS — F17210 Nicotine dependence, cigarettes, uncomplicated: Secondary | ICD-10-CM | POA: Insufficient documentation

## 2019-06-01 DIAGNOSIS — L03119 Cellulitis of unspecified part of limb: Secondary | ICD-10-CM

## 2019-06-01 DIAGNOSIS — L03113 Cellulitis of right upper limb: Secondary | ICD-10-CM | POA: Insufficient documentation

## 2019-06-01 DIAGNOSIS — L0291 Cutaneous abscess, unspecified: Secondary | ICD-10-CM

## 2019-06-01 MED ORDER — IBUPROFEN 800 MG PO TABS: 800.0000 mg | ORAL_TABLET | Freq: Once | ORAL | Status: DC

## 2019-06-01 MED ORDER — HYDROCODONE-ACETAMINOPHEN 5-325 MG PO TABS: 1.0000 | ORAL_TABLET | Freq: Once | ORAL | Status: DC

## 2019-06-01 MED ORDER — LIDOCAINE HCL (PF) 1 % IJ SOLN
5.0000 mL | Freq: Once | INTRAMUSCULAR | Status: AC
  Administered 2019-06-01: 13:00:00 5 mL
  Filled 2019-06-01: qty 30

## 2019-06-01 MED ORDER — OXYCODONE-ACETAMINOPHEN 5-325 MG PO TABS
1.0000 | ORAL_TABLET | Freq: Once | ORAL | Status: AC
  Administered 2019-06-01: 1 via ORAL
  Filled 2019-06-01: qty 1

## 2019-06-01 MED ORDER — OXYCODONE HCL 5 MG PO TABS: 5.0000 mg | ORAL_TABLET | Freq: Four times a day (QID) | ORAL | 0 refills | Status: DC | PRN

## 2019-06-01 MED ORDER — DOXYCYCLINE HYCLATE 100 MG PO TABS: 100.0000 mg | ORAL_TABLET | Freq: Two times a day (BID) | ORAL | 0 refills | Status: AC

## 2019-06-01 MED ORDER — OXYCODONE-ACETAMINOPHEN 5-325 MG PO TABS
1.0000 | ORAL_TABLET | Freq: Once | ORAL | Status: AC
  Administered 2019-06-01: 13:00:00 1 via ORAL
  Filled 2019-06-01: qty 1

## 2019-06-01 MED FILL — DOXYCYCLINE HYCLATE 100 MG: 100 | 5 days supply | Qty: 10 | Fill #0

## 2019-06-01 MED FILL — oxyCODONE HCL 5 MG TABS: 5 | 2 days supply | Qty: 5 | Fill #0

## 2019-06-01 NOTE — Discharge Instructions (Addendum)
Thank you for coming into the ED to get care.  Today, reviewed performed an incision and drainage of the abscess on your right elbow.  For further care, please keep the area clean and allow fluid to drain from the area.  You do not have to pack the area.  If you notice any worsening of pain, new onset fevers, chills, worsening of redness or swelling, please go to your PCP for further evaluation.  Please see the attached information about incision and drainage aftercare.  I am also changing your antibiotic to doxycycline.  Please take this twice daily for 5 days.  It is important that you complete the entire course, even if your arm appears or feels better.

## 2019-06-01 NOTE — ED Notes (Signed)
ED Provider at bedside. 

## 2019-06-01 NOTE — ED Provider Notes (Addendum)
[facility] DEPT Provider Note   CSN: [id number] Arrival date & time: 06/01/19  1010  History    Chief Complaint  Patient presents with  . Elbow Pain   HPI Kevin Parker is a 43 y.o. 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  Patient was seen last week on 05/26/2019 for assessment of right nare abscess and expanding cellulitis of his right elbow.  At the time, he was able to express some fluid from the area.  Patient was sent home on Bactrim p.o.  Patient denies fevers, chills, any changes in range of motion, motor ability, sensation.  His last drug use was 10 days ago.  Reports that he is in a lot of pain.  He reports that when he was in the shower, he was able to massage some pus out of the wound.  He came to the ED because he was in so much pain.  Allergies: Patient has no known allergies. Medications:  Current Outpatient Medications  Medication Instructions  . doxycycline (VIBRA-TABS) 100 mg, Oral, 2 times daily  . ibuprofen (ADVIL) 600 mg, Oral, Every 6 hours PRN  . sulfamethoxazole-trimethoprim (BACTRIM DS) 800-160 MG tablet 1 tablet, Oral, 2 times daily   Past Medical/Surgical History Past Medical History:  Diagnosis Date  . IV drug user     Patient Active Problem List   Diagnosis Date Noted  . Heroin use 06/12/2016  . Polysubstance abuse ([place]) 06/12/2016  . Hepatitis C virus infection 06/12/2016  . ETOH abuse 06/12/2016  . Substance addiction ([place]) 06/18/2011    Past Surgical History:  Procedure Laterality Date  . APPENDECTOMY      No family history on file. Social History   Tobacco Use  . Smoking status: Current Every Day Smoker    Packs/day: 1.00  . Smokeless tobacco: Never Used  Substance Use Topics  . Alcohol use: Yes    Comment: weekly  . Drug use: Yes    Types: IV   Review of Systems Review of Systems   Constitutional: Negative for chills and fever.  HENT: Negative for dental problem, ear pain and sore throat.   Eyes: Negative for pain and visual disturbance.  Respiratory: Negative for cough and shortness of breath.   Cardiovascular: Negative for chest pain and palpitations.  Gastrointestinal: Negative for abdominal pain and vomiting.  Genitourinary: Negative for dysuria and hematuria.  Musculoskeletal: Negative for arthralgias, back pain, joint swelling, myalgias and neck pain.  Skin: Positive for rash and wound. Negative for color change.  Neurological: Negative for dizziness, seizures, syncope, numbness and headaches.  All other systems reviewed and are negative.  Physical Exam Updated Vital Signs BP 139/88   Pulse 89   Temp 98.3 F (36.8 C) (Oral)   Resp 16   SpO2 98%   Physical Exam Vitals signs and nursing note reviewed.  Constitutional:      Appearance: He is well-developed.  HENT:     Head: Normocephalic and atraumatic.     Nose:     Comments: Mildly swollen and erythematous.  Appears improved from previous picture on chart. Eyes:     Conjunctiva/sclera: Conjunctivae normal.  Neck:     Musculoskeletal: Neck supple.  Cardiovascular:     Rate and Rhythm: Normal rate and regular rhythm.     Heart sounds: No murmur.  Pulmonary:     Effort: Pulmonary effort  is normal. No respiratory distress.     Breath sounds: Normal breath sounds.  Abdominal:     Palpations: Abdomen is soft.     Tenderness: There is no abdominal tenderness.  Musculoskeletal:     Comments: There is no limited range of motion of his right elbow.    Skin:    General: Skin is warm and dry.     Capillary Refill: Capillary refill takes less than 2 seconds.     Comments: And there is a large area of erythema on patient's right posterior arm.  In the middle of the wound, there is fluctuance with spontaneous draining at one site.  Surrounding the fluctuance, is areas of endurance or pitting is  appreciated.  He does not have any striations extending up his arms.    Neurological:     General: No focal deficit present.     Mental Status: He is alert and oriented to person, place, and time.     ED Treatments / Results  Labs (all labs ordered are listed, but only abnormal results are displayed) Labs Reviewed - No data to display  EKG None  Radiology No results found.  Procedures .Marland KitchenIncision and Drainage  Date/Time: 06/01/2019 3:34 PM Performed by: Melene Plan, MD Authorized by: Gwyneth Sprout, MD   Consent:    Consent obtained:  Verbal   Consent given by:  Patient   Risks discussed:  Bleeding, infection, incomplete drainage, pain and damage to other organs   Alternatives discussed:  No treatment Location:    Type:  Abscess   Size:  6 cm   Location:  Upper extremity   Upper extremity location:  Elbow   Elbow location:  R elbow Pre-procedure details:    Skin preparation:  Betadine and antiseptic wash Anesthesia (see MAR for exact dosages):    Anesthesia method:  Local infiltration   Local anesthetic:  Lidocaine 1% w/o epi Procedure type:    Complexity:  Complex Procedure details:    Needle aspiration: no     Incision types:  Stab incision   Incision depth:  Subcutaneous   Scalpel blade:  10   Wound management:  Probed and deloculated and irrigated with saline   Drainage:  Bloody and purulent   Drainage amount:  Moderate   Wound treatment:  Wound left open   Packing materials:  None Post-procedure details:    Patient tolerance of procedure:  Tolerated well, no immediate complications Comments:     Confirmed with bedside ultrasound that no pockets of fluid were missed.    (including critical care time)  Medications Ordered in ED Medications  lidocaine (PF) (XYLOCAINE) 1 % injection 5 mL (5 mLs Infiltration Given by Other 06/01/19 1257)  oxyCODONE-acetaminophen (PERCOCET/ROXICET) 5-325 MG per tablet 1 tablet (1 tablet Oral Given 06/01/19 1256)   oxyCODONE-acetaminophen (PERCOCET/ROXICET) 5-325 MG per tablet 1 tablet (1 tablet Oral Given 06/01/19 1451)    Initial Impression / Assessment and Plan / ED Course  I have reviewed the triage vital signs and the nursing notes. Pertinent labs & imaging results that were available during my care of the patient were reviewed by me and considered in my medical decision making (see chart for details). Patient is a 43 year old male with past medical history significant for IV drug use, substance abuse, history of multiple abscesses, who presents for continued follow-up of right elbow cellulitis.  Patient has had worsening of cellulitis despite Bactrim oral therapy for the last week.  Patient reports that he has  worsening pain in the area.  Patient denies any fevers, chills, joint pain, difficulty moving his joint.  His physical exam is typical of a cellulitis and I am not concerned about any joint involvement.  Patient does not have any history of endocarditis. Performed successful I&D of the area.  Patient tolerated procedure well.  Please see procedure note for further information.   Provided patient with 5 mg of oxycodone x2 for pain management.  Patient reports that he has a high tolerance for pain medication.  Patient did express some relief with the initial 5 mg of oxycodone so I did not increase the dose.  I will discharge patient home with p.o. antibiotics and instructions to find a PCP for further follow-up.  Given worsened infection with Bactrim, could consider this failure of p.o. for cellulitis.  I will send patient home with doxycycline for MRSA twice daily x5 days.  Will not add amoxicillin for further strep given infection is most consistent with MRSA.  Pt is asking for pain medicaiton. Reassured patient that pain should be improved with drainage. Given trauma from I & D, will send patient #5 of 5mg  oxycodone electronically to help for the next 24 hours, which is likely to be the time where he  has the most pain after I/D.   Provided patient with return precautions including fever, chills, worsening erythema, worsening swelling.   Final Clinical Impressions(s) / ED Diagnoses   Final diagnoses:  Cellulitis of elbow  Abscess   ED Discharge Orders         Ordered    doxycycline (VIBRA-TABS) 100 MG tablet  2 times daily     06/01/19 1554         Disposition:  home  Melene Planachel E. Derl Abalos, M.D. FM PGY-2      Melene PlanKim, Takoda Janowiak E, MD 06/01/19 1556    Melene PlanKim, Josealberto Montalto E, MD 06/01/19 [id number]    Gwyneth SproutPlunkett, Whitney, MD 06/01/19 2130

## 2019-06-01 NOTE — ED Triage Notes (Signed)
Pt has increased swelling, pain and erythema to right elbow since he was here on 10/21 for abscess in nose.

## 2019-07-07 ENCOUNTER — Ambulatory Visit: Payer: Self-pay | Admitting: Internal Medicine

## 2019-07-30 ENCOUNTER — Encounter (HOSPITAL_COMMUNITY): Payer: Self-pay | Admitting: Emergency Medicine

## 2019-07-30 ENCOUNTER — Other Ambulatory Visit: Payer: Self-pay

## 2019-07-30 ENCOUNTER — Emergency Department (HOSPITAL_COMMUNITY)
Admission: EM | Admit: 2019-07-30 | Discharge: 2019-07-30 | Discharge status: Against Medical Advice | Payer: Self-pay | Attending: Emergency Medicine | Admitting: Emergency Medicine

## 2019-07-30 DIAGNOSIS — Z5321 Procedure and treatment not carried out due to patient leaving prior to being seen by health care provider: Secondary | ICD-10-CM | POA: Insufficient documentation

## 2019-07-30 LAB — ETHANOL: Alcohol, Ethyl (B): <10 mg/dL (ref <10)

## 2019-07-30 LAB — CBC
HCT: 42.6 % (ref 39.0–52.0)
Hemoglobin: 14 g/dL (ref 13.0–17.0)
MCH: 31.5 pg (ref 26.0–34.0)
MCHC: 32.9 g/dL (ref 30.0–36.0)
MCV: 95.7 fL (ref 80.0–100.0)
Platelets: 234 10*3/uL (ref 150–400)
RBC: 4.45 MIL/uL (ref 4.22–5.81)
RDW: 14.9 % (ref 11.5–15.5)
WBC: 7.2 10*3/uL (ref 4.0–10.5)
nRBC: 0 % (ref 0.0–0.2)

## 2019-07-30 LAB — COMPREHENSIVE METABOLIC PANEL
ALT: 15 U/L (ref 0–44)
AST: 24 U/L (ref 15–41)
Albumin: 3.9 g/dL (ref 3.5–5.0)
Alkaline Phosphatase: 69 U/L (ref 38–126)
Anion gap: 7 (ref 5–15)
BUN: 25 mg/dL — ABNORMAL HIGH (ref 6–20)
CO2: 31 mmol/L (ref 22–32)
Calcium: 9.1 mg/dL (ref 8.9–10.3)
Chloride: 102 mmol/L (ref 98–111)
Creatinine, Ser: 1.12 mg/dL (ref 0.61–1.24)
GFR calc Af Amer: >60 mL/min (ref >60)
GFR calc non Af Amer: >60 mL/min (ref >60)
Glucose, Bld: 102 mg/dL — ABNORMAL HIGH (ref 70–99)
Potassium: 4.7 mmol/L (ref 3.5–5.1)
Sodium: 140 mmol/L (ref 135–145)
Total Bilirubin: 0.3 mg/dL (ref 0.3–1.2)
Total Protein: 7.1 g/dL (ref 6.5–8.1)

## 2019-07-30 LAB — CBG MONITORING, ED: Glucose-Capillary: 102 mg/dL — ABNORMAL HIGH (ref 70–99)

## 2019-07-30 MED ORDER — SODIUM CHLORIDE 0.9% FLUSH: 3.0000 mL | Freq: Once | INTRAVENOUS | Status: DC

## 2019-07-30 NOTE — ED Triage Notes (Addendum)
Arrived via EMS from home who lives with his brother and sister in law. Family called EMS because patient on the floor altered lethargic. Patient drank alcohol, smoked marijuana, and took to tablets of Xanax prior to arrival. Denies SI or HI.  EMS administered 0.9 NS 500 ML bolus prior to arrival.

## 2019-07-30 NOTE — ED Notes (Signed)
Pt is talking to brother on phone at this time.

## 2019-07-30 NOTE — ED Notes (Signed)
PT reports to this tech he would like to leave without being seen by a doctor. Patient was encouraged to stay to be seen by an EDP, however PT reports he has things to do, and he needs to go. PT had 18gg IV in R AC, and it was removed. PT did not want this tech to hold pressure once IV was removed, pt instructed that he would do so. PT did not hold pressure, and was bleeding once he put his jacket on, and came and told this tech. This tech assisted patient, and bleeding was controlled prior to patient leaving.

## 2019-11-05 ENCOUNTER — Encounter (HOSPITAL_COMMUNITY): Payer: Self-pay | Admitting: Emergency Medicine

## 2019-11-05 ENCOUNTER — Emergency Department (HOSPITAL_COMMUNITY)
Admission: EM | Admit: 2019-11-05 | Discharge: 2019-11-05 | Disposition: A | Discharge status: Home / Self Care | Payer: Self-pay | Attending: Emergency Medicine | Admitting: Emergency Medicine

## 2019-11-05 ENCOUNTER — Emergency Department (HOSPITAL_COMMUNITY): Payer: Self-pay

## 2019-11-05 ENCOUNTER — Other Ambulatory Visit: Payer: Self-pay

## 2019-11-05 DIAGNOSIS — X58XXXA Exposure to other specified factors, initial encounter: Secondary | ICD-10-CM | POA: Insufficient documentation

## 2019-11-05 DIAGNOSIS — Y999 Unspecified external cause status: Secondary | ICD-10-CM | POA: Insufficient documentation

## 2019-11-05 DIAGNOSIS — S61200A Unspecified open wound of right index finger without damage to nail, initial encounter: Secondary | ICD-10-CM | POA: Insufficient documentation

## 2019-11-05 DIAGNOSIS — Y939 Activity, unspecified: Secondary | ICD-10-CM | POA: Insufficient documentation

## 2019-11-05 DIAGNOSIS — L03115 Cellulitis of right lower limb: Secondary | ICD-10-CM | POA: Insufficient documentation

## 2019-11-05 DIAGNOSIS — L02415 Cutaneous abscess of right lower limb: Secondary | ICD-10-CM | POA: Insufficient documentation

## 2019-11-05 DIAGNOSIS — Y929 Unspecified place or not applicable: Secondary | ICD-10-CM | POA: Insufficient documentation

## 2019-11-05 DIAGNOSIS — F17210 Nicotine dependence, cigarettes, uncomplicated: Secondary | ICD-10-CM | POA: Insufficient documentation

## 2019-11-05 DIAGNOSIS — L0291 Cutaneous abscess, unspecified: Secondary | ICD-10-CM

## 2019-11-05 MED ORDER — DOXYCYCLINE HYCLATE 100 MG PO CAPS: 100.0000 mg | ORAL_CAPSULE | Freq: Two times a day (BID) | ORAL | 0 refills | Status: AC

## 2019-11-05 MED ORDER — LIDOCAINE-EPINEPHRINE (PF) 2 %-1:200000 IJ SOLN
10.0000 mL | Freq: Once | INTRAMUSCULAR | Status: AC
  Administered 2019-11-05: 10 mL
  Filled 2019-11-05: qty 20

## 2019-11-05 NOTE — ED Triage Notes (Signed)
C/o red swollen area on right upper thigh- has black center- warm to touch - happened Wednesday- does not know if it is a spider bite-  Also has pain in right index finger- swollen. No known injury

## 2019-11-05 NOTE — Discharge Instructions (Signed)
Take the antibiotics as prescribed.  I have written a prescription for this.  You need to follow-up in 2 days for a wound recheck.   The cellulitis was outlined.  If you notice it starts going outside the boundaries within 24 hours please seek reevaluation

## 2019-11-05 NOTE — ED Provider Notes (Signed)
[facility] EMERGENCY DEPARTMENT Provider Note   CSN: [id number] Arrival date & time: 11/05/19  1613     History Chief Complaint  Patient presents with  . Insect Bite  . Finger Injury    Kevin Parker is a 44 y.o. male with hospital history significant for polysubstance abuse, alcohol abuse who presents for evaluation of second bite.  Patient states he recently moved into a new place.  States he woke up this morning and noticed an area of redness with some drainage to his right anterior thigh.  He denies any recent IV drug use.  Mild surrounding erythema and warmth.  Denies fever, chills, nausea, vomiting, chest pain, shortness of breath, abdominal pain, diarrhea, dysuria, myalgias, pain with movement.  Patient also with right index finger erythema.  Has noticed some intermittent swelling.  He has full range of motion without difficulty.  Again he denies any IV drug use to this site.  Denies aggravating or alleviating factors.  States no longer uses IVDU  History obtained from patient and past medical records.  No interpreter is used.  HPI     Past Medical History:  Diagnosis Date  . IV drug user     Patient Active Problem List   Diagnosis Date Noted  . Heroin use 06/12/2016  . Polysubstance abuse ([facility]) 06/12/2016  . Hepatitis C virus infection 06/12/2016  . ETOH abuse 06/12/2016  . Substance addiction ([facility]) 06/18/2011    Past Surgical History:  Procedure Laterality Date  . APPENDECTOMY         No family history on file.  Social History   Tobacco Use  . Smoking status: Current Every Day Smoker    Packs/day: 1.00  . Smokeless tobacco: Never Used  Substance Use Topics  . Alcohol use: Not Currently    Comment: weekly  . Drug use: Yes    Types: Marijuana, IV    Comment: heroin-- uses daily    Home Medications Prior to Admission medications   Medication Sig Start Date End Date Taking? Authorizing Provider  doxycycline (VIBRAMYCIN) 100 MG  capsule Take 1 capsule (100 mg total) by mouth 2 (two) times daily for 7 days. 11/05/19 11/12/19  ,  A, PA-C  ibuprofen (ADVIL) 600 MG tablet Take 1 tablet (600 mg total) by mouth every 6 (six) hours as needed. 05/26/19   Liberty Handy, PA-C  oxyCODONE (ROXICODONE) 5 MG immediate release tablet Take 1 tablet (5 mg total) by mouth every 6 (six) hours as needed for severe pain. 06/01/19   Melene Plan, MD    Allergies    Patient has no known allergies.  Review of Systems   Review of Systems  Constitutional: Negative.   HENT: Negative.   Respiratory: Negative.   Cardiovascular: Negative.   Gastrointestinal: Negative.   Genitourinary: Negative.   Musculoskeletal: Negative.        Right index finger pain  Skin: Positive for wound.  Neurological: Negative.   All other systems reviewed and are negative.   Physical Exam Updated Vital Signs BP (!) 144/84   Pulse 91   Temp 98 F (36.7 C) (Oral)   Resp 16   Ht 5\' 11"  (1.803 m)   Wt 77.1 kg   SpO2 100%   BMI 23.71 kg/m   Physical Exam Vitals and nursing note reviewed.  Constitutional:      General: He is not in acute distress.    Appearance: He is well-developed. He is not ill-appearing, toxic-appearing or diaphoretic.  HENT:     Head: Normocephalic and atraumatic.     Nose: Nose normal.     Mouth/Throat:     Mouth: Mucous membranes are moist.     Pharynx: Oropharynx is clear.  Eyes:     Pupils: Pupils are equal, round, and reactive to light.  Cardiovascular:     Rate and Rhythm: Normal rate and regular rhythm.     Pulses: Normal pulses.     Heart sounds: Normal heart sounds.  Pulmonary:     Effort: Pulmonary effort is normal. No respiratory distress.     Breath sounds: Normal breath sounds.  Abdominal:     General: Bowel sounds are normal. There is no distension.     Palpations: Abdomen is soft.     Tenderness: There is no abdominal tenderness. There is no right CVA tenderness, left CVA tenderness or  guarding.  Musculoskeletal:        General: Normal range of motion.     Right hand: Swelling and tenderness present.     Left hand: Normal.       Hands:     Cervical back: Normal range of motion and neck supple.     Right hip: Normal.     Left hip: Normal.     Right upper leg: Tenderness present. No swelling, edema, deformity, lacerations or bony tenderness.     Left upper leg: Normal.       Legs:     Comments: Tenderness and some mild swelling to index finger to right upper extremity.  No fusiform swelling, no tenderness over flexor or extensor tendon.  No streaking or erythema extending up the arm.  No obvious rashes or lesions.  4 cm area of erythema and induration to right anterior thigh.  Punctate area of fluctuance central area.  See picture in chart  Full range of motion bilateral upper and lower extremities at difficulty.  Compartments soft.  No bony tenderness  Skin:    General: Skin is warm and dry.     Capillary Refill: Capillary refill takes less than 2 seconds.     Comments: Consistent with abscess to right anterior thigh.  4 cm of induration with punctate area of purulent draining fluctuance.  Neurological:     Mental Status: He is alert.     Cranial Nerves: Cranial nerves are intact.     Sensory: Sensation is intact.     Motor: Motor function is intact.     Coordination: Coordination is intact.     Gait: Gait is intact.       ED Results / Procedures / Treatments   Labs (all labs ordered are listed, but only abnormal results are displayed) Labs Reviewed - No data to display  EKG None  Radiology DG Finger Index Right  Result Date: 11/05/2019 CLINICAL DATA:  Finger swelling EXAM: RIGHT INDEX FINGER 2+V COMPARISON:  None. FINDINGS: Diffuse soft tissue swelling. No fracture or malalignment. No radiopaque foreign body or soft tissue emphysema IMPRESSION: No acute osseous abnormality Electronically Signed   By: Jasmine Pang M.D.   On: 11/05/2019 16:55     Procedures .Marland KitchenIncision and Drainage  Date/Time: 11/05/2019 5:40 PM Performed by: Linwood Dibbles, PA-C Authorized by: Linwood Dibbles, PA-C   Consent:    Consent obtained:  Verbal   Consent given by:  Patient   Risks discussed:  Bleeding, incomplete drainage, pain and damage to other organs   Alternatives discussed:  No treatment Universal protocol:    Procedure  explained and questions answered to patient or proxy's satisfaction: yes     Relevant documents present and verified: yes     Test results available and properly labeled: yes     Imaging studies available: yes     Required blood products, implants, devices, and special equipment available: yes     Site/side marked: yes     Immediately prior to procedure a time out was called: yes     Patient identity confirmed:  Verbally with patient Location:    Type:  Abscess   Location:  Lower extremity   Lower extremity location:  Leg   Leg location:  R upper leg Pre-procedure details:    Skin preparation:  Betadine Anesthesia (see MAR for exact dosages):    Anesthesia method:  Local infiltration   Local anesthetic:  Lidocaine 1% WITH epi Procedure type:    Complexity:  Complex Procedure details:    Incision types:  Single straight   Incision depth:  Subcutaneous   Scalpel blade:  11   Wound management:  Probed and deloculated, irrigated with saline and extensive cleaning   Drainage:  Purulent   Drainage amount:  Moderate Post-procedure details:    Patient tolerance of procedure:  Tolerated well, no immediate complications   (including critical care time)  Medications Ordered in ED Medications  lidocaine-EPINEPHrine (XYLOCAINE W/EPI) 2 %-1:200000 (PF) injection 10 mL (has no administration in time range)    ED Course  I have reviewed the triage vital signs and the nursing notes.  Pertinent labs & imaging results that were available during my care of the patient were reviewed by me and considered in my medical  decision making (see chart for details).   44 year old male appears otherwise well presents for evaluation of 2 wounds.  He is afebrile, nonseptic, non-ill-appearing.  Patient with history of IV drug use however states he has not used in years.  Patient with some erythema and tenderness to his index finger to his right upper extremity.  No fusiform swelling, tenderness to flexor or extensor tendon.  He has no streaking extending up his arm.  He has full range of motion without difficulty.  Low suspicion for flexor tenosynovitis.  No obvious fluctuance, induration to suggest abscess.  Low suspicion for septic joint.  X-ray obtained from triage does not show any evidence of fracture, dislocation.  Patient also with 4 cm area of induration to his right anterior mid central thigh.  There is punctate area of purulent drainage with some fluctuance.  There is some mild surrounding cellulitis.  His compartments are soft.  I low suspicion for myositis or abscess extending into the muscle, tendon or ligament.  Will attempt bedside drainage.  Will place on antibiotics and outline wound.  He will need to follow-up in 2 days for wound recheck return for new or worsening symptoms of sooner received a note.  Tolerated well.  Some minor purulent drainage however more serosanguineous drainage.  The patient has been appropriately medically screened and/or stabilized in the ED. I have low suspicion for any other emergent medical condition which would require further screening, evaluation or treatment in the ED or require inpatient management.  Patient is hemodynamically stable and in no acute distress.  Patient able to ambulate in department prior to ED.  Evaluation does not show acute pathology that would require ongoing or additional emergent interventions while in the emergency department or further inpatient treatment.  I have discussed the diagnosis with the patient and answered all questions.  Pain is been managed  while in the emergency department and patient has no further complaints prior to discharge.  Patient is comfortable with plan discussed in room and is stable for discharge at this time.  I have discussed strict return precautions for returning to the emergency department.  Patient was encouraged to follow-up with PCP/specialist refer to at discharge.    MDM Rules/Calculators/A&P                      Final Clinical Impression(s) / ED Diagnoses Final diagnoses:  Abscess  Cellulitis of right lower extremity    Rx / DC Orders ED Discharge Orders         Ordered    doxycycline (VIBRAMYCIN) 100 MG capsule  2 times daily     11/05/19 1742           ,  A, PA-C 11/05/19 1742    Tegeler, Canary Brim, MD 11/05/19 2308

## 2019-12-16 ENCOUNTER — Ambulatory Visit: Payer: Self-pay | Attending: Internal Medicine

## 2020-01-03 ENCOUNTER — Emergency Department (HOSPITAL_COMMUNITY): Payer: Self-pay

## 2020-01-03 ENCOUNTER — Encounter (HOSPITAL_COMMUNITY): Payer: Self-pay | Admitting: Emergency Medicine

## 2020-01-03 ENCOUNTER — Other Ambulatory Visit: Payer: Self-pay

## 2020-01-03 ENCOUNTER — Emergency Department (HOSPITAL_COMMUNITY)
Admission: EM | Admit: 2020-01-03 | Discharge: 2020-01-03 | Disposition: A | Discharge status: Home / Self Care | Payer: Self-pay | Attending: Emergency Medicine | Admitting: Emergency Medicine

## 2020-01-03 DIAGNOSIS — Y999 Unspecified external cause status: Secondary | ICD-10-CM | POA: Insufficient documentation

## 2020-01-03 DIAGNOSIS — D649 Anemia, unspecified: Secondary | ICD-10-CM | POA: Insufficient documentation

## 2020-01-03 DIAGNOSIS — L0390 Cellulitis, unspecified: Secondary | ICD-10-CM

## 2020-01-03 DIAGNOSIS — Y929 Unspecified place or not applicable: Secondary | ICD-10-CM | POA: Insufficient documentation

## 2020-01-03 DIAGNOSIS — X58XXXA Exposure to other specified factors, initial encounter: Secondary | ICD-10-CM | POA: Insufficient documentation

## 2020-01-03 DIAGNOSIS — F17210 Nicotine dependence, cigarettes, uncomplicated: Secondary | ICD-10-CM | POA: Insufficient documentation

## 2020-01-03 DIAGNOSIS — Y9389 Activity, other specified: Secondary | ICD-10-CM | POA: Insufficient documentation

## 2020-01-03 DIAGNOSIS — L03116 Cellulitis of left lower limb: Secondary | ICD-10-CM | POA: Insufficient documentation

## 2020-01-03 DIAGNOSIS — S9032XA Contusion of left foot, initial encounter: Secondary | ICD-10-CM | POA: Insufficient documentation

## 2020-01-03 LAB — CBC WITH DIFFERENTIAL/PLATELET
Abs Immature Granulocytes: 0.02 10*3/uL (ref 0.00–0.07)
Basophils Absolute: 0 10*3/uL (ref 0.0–0.1)
Basophils Relative: 0 %
Eosinophils Absolute: 0.1 10*3/uL (ref 0.0–0.5)
Eosinophils Relative: 1 %
HCT: 37.1 % — ABNORMAL LOW (ref 39.0–52.0)
Hemoglobin: 11.8 g/dL — ABNORMAL LOW (ref 13.0–17.0)
Immature Granulocytes: 0 %
Lymphocytes Relative: 20 %
Lymphs Abs: 1.7 10*3/uL (ref 0.7–4.0)
MCH: 28.9 pg (ref 26.0–34.0)
MCHC: 31.8 g/dL (ref 30.0–36.0)
MCV: 90.9 fL (ref 80.0–100.0)
Monocytes Absolute: 0.8 10*3/uL (ref 0.1–1.0)
Monocytes Relative: 10 %
Neutro Abs: 5.8 10*3/uL (ref 1.7–7.7)
Neutrophils Relative %: 69 %
Platelets: 247 10*3/uL (ref 150–400)
RBC: 4.08 MIL/uL — ABNORMAL LOW (ref 4.22–5.81)
RDW: 14.7 % (ref 11.5–15.5)
WBC: 8.4 10*3/uL (ref 4.0–10.5)
nRBC: 0 % (ref 0.0–0.2)

## 2020-01-03 LAB — COMPREHENSIVE METABOLIC PANEL
ALT: 18 U/L (ref 0–44)
AST: 20 U/L (ref 15–41)
Albumin: 3.7 g/dL (ref 3.5–5.0)
Alkaline Phosphatase: 82 U/L (ref 38–126)
Anion gap: 9 (ref 5–15)
BUN: 23 mg/dL — ABNORMAL HIGH (ref 6–20)
CO2: 28 mmol/L (ref 22–32)
Calcium: 8.7 mg/dL — ABNORMAL LOW (ref 8.9–10.3)
Chloride: 106 mmol/L (ref 98–111)
Creatinine, Ser: 0.83 mg/dL (ref 0.61–1.24)
GFR calc Af Amer: >60 mL/min (ref >60)
GFR calc non Af Amer: >60 mL/min (ref >60)
Glucose, Bld: 104 mg/dL — ABNORMAL HIGH (ref 70–99)
Potassium: 4.1 mmol/L (ref 3.5–5.1)
Sodium: 143 mmol/L (ref 135–145)
Total Bilirubin: 0.3 mg/dL (ref 0.3–1.2)
Total Protein: 7.3 g/dL (ref 6.5–8.1)

## 2020-01-03 LAB — LACTIC ACID, PLASMA: Lactic Acid, Venous: 1.1 mmol/L (ref 0.5–1.9)

## 2020-01-03 MED ORDER — DOXYCYCLINE HYCLATE 100 MG PO TABS: 100.0000 mg | ORAL_TABLET | Freq: Two times a day (BID) | ORAL | 0 refills | Status: DC

## 2020-01-03 MED ORDER — NAPROXEN 500 MG PO TABS: 500.0000 mg | ORAL_TABLET | Freq: Two times a day (BID) | ORAL | 0 refills | Status: DC

## 2020-01-03 MED ORDER — NAPROXEN 500 MG PO TABS
500.0000 mg | ORAL_TABLET | Freq: Once | ORAL | Status: AC
  Administered 2020-01-03: 500 mg via ORAL
  Filled 2020-01-03: qty 1

## 2020-01-03 MED ORDER — SODIUM CHLORIDE 0.9% FLUSH: 3.0000 mL | Freq: Once | INTRAVENOUS | Status: DC

## 2020-01-03 MED ORDER — DOXYCYCLINE HYCLATE 100 MG PO TABS
100.0000 mg | ORAL_TABLET | Freq: Once | ORAL | Status: AC
  Administered 2020-01-03: 100 mg via ORAL
  Filled 2020-01-03: qty 1

## 2020-01-03 NOTE — ED Triage Notes (Signed)
Patient states that he started having redness on Friday in his left top of foot by trying to inject drugs into a vein. Patient foot is redden and swollen. Patient also dropped a center block on his foot yesterday.

## 2020-01-03 NOTE — ED Notes (Signed)
Blue and Gold top collected 

## 2020-01-03 NOTE — Discharge Instructions (Signed)
Follow-up with your primary care doctor for further evaluation of your fatigue.  Follow-up with the outpatient resources to help you with your opiate use.  Take the medications as prescribed.  Return to the ED for fevers worsening symptoms

## 2020-01-03 NOTE — ED Provider Notes (Signed)
[facility]-EMERGENCY DEPT Provider Note   CSN: [id number] Arrival date & time: 01/03/20  1902     History Chief Complaint  Patient presents with  . Foot Injury    Kevin Parker is a 44 y.o. male.  HPI   Pt presents to the ED with complaints of foot pain. 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  He injected in his foot and missed the vein.  He had some swelling in his foot following that but nothing severe.  Patient states he then dropped a cinder block on his left foot the day or 2 later.  He has had persistent pain in his left foot.  He has not had any fevers or chills.  He denies any redness streaking up his leg but he has noticed redness in his foot where he dropped a cinder block.  Patient also has noticed that he has felt fatigued somewhat.  No fever.  No cough.  No covid vaccine.  Past Medical History:  Diagnosis Date  . IV drug user     Patient Active Problem List   Diagnosis Date Noted  . Heroin use 06/12/2016  . Polysubstance abuse (HCC) 06/12/2016  . Hepatitis C virus infection 06/12/2016  . ETOH abuse 06/12/2016  . Substance addiction (HCC) 06/18/2011    Past Surgical History:  Procedure Laterality Date  . APPENDECTOMY         History reviewed. No pertinent family history.  Social History   Tobacco Use  . Smoking status: Current Every Day Smoker    Packs/day: 1.00  . Smokeless tobacco: Never Used  Substance Use Topics  . Alcohol use: Not Currently    Comment: weekly  . Drug use: Yes    Types: Marijuana, IV    Comment: heroin-- uses daily    Home Medications Prior to Admission medications   Medication Sig Start Date End Date Taking? Authorizing Provider  doxycycline (VIBRA-TABS) 100 MG tablet Take 1 tablet (100 mg total) by mouth 2 (two) times daily. 01/03/20   Linwood Dibbles, MD  ibuprofen (ADVIL) 600 MG tablet Take 1 tablet (600 mg total) by mouth every 6 (six) hours as needed. 05/26/19   Liberty Handy, PA-C  naproxen  (NAPROSYN) 500 MG tablet Take 1 tablet (500 mg total) by mouth 2 (two) times daily with a meal. As needed for pain 01/03/20   Linwood Dibbles, MD  oxyCODONE (ROXICODONE) 5 MG immediate release tablet Take 1 tablet (5 mg total) by mouth every 6 (six) hours as needed for severe pain. 06/01/19   Melene Plan, MD    Allergies    Patient has no known allergies.  Review of Systems   Review of Systems  Constitutional: Negative for fever.  Cardiovascular: Negative for chest pain.  All other systems reviewed and are negative.   Physical Exam Updated Vital Signs BP 119/73 (BP Location: Left Arm)   Pulse 80   Temp 98 F (36.7 C) (Oral)   Resp 18   Ht 1.803 m (5\' 11" )   Wt 79.4 kg   SpO2 94%   BMI 24.41 kg/m   Physical Exam Vitals and nursing note reviewed.  Constitutional:      General: He is not in acute distress.    Appearance: He is well-developed.  HENT:     Head: Normocephalic and atraumatic.     Right Ear: External ear normal.     Left Ear: External ear normal.  Eyes:     General: No  scleral icterus.       Right eye: No discharge.        Left eye: No discharge.     Conjunctiva/sclera: Conjunctivae normal.  Neck:     Trachea: No tracheal deviation.  Cardiovascular:     Rate and Rhythm: Normal rate and regular rhythm.     Pulses: Normal pulses.     Heart sounds: No murmur.     Comments: No murmur appreciated on exam Pulmonary:     Effort: Pulmonary effort is normal. No respiratory distress.     Breath sounds: Normal breath sounds. No stridor. No wheezing or rales.  Abdominal:     General: Bowel sounds are normal. There is no distension.     Palpations: Abdomen is soft.     Tenderness: There is no abdominal tenderness. There is no guarding or rebound.  Musculoskeletal:        General: Tenderness present.     Cervical back: Neck supple.     Comments: Dorsal aspect of left foot with erythema over the midfoot, mild tenderness palpation, no lymphangitic streaking, no  fluctuation or induration  Skin:    General: Skin is warm and dry.     Findings: No rash.  Neurological:     Mental Status: He is alert.     Cranial Nerves: No cranial nerve deficit (no facial droop, extraocular movements intact, no slurred speech).     Sensory: No sensory deficit.     Motor: No abnormal muscle tone or seizure activity.     Coordination: Coordination normal.     ED Results / Procedures / Treatments   Labs (all labs ordered are listed, but only abnormal results are displayed) Labs Reviewed  COMPREHENSIVE METABOLIC PANEL - Abnormal; Notable for the following components:      Result Value   Glucose, Bld 104 (*)    BUN 23 (*)    Calcium 8.7 (*)    All other components within normal limits  CBC WITH DIFFERENTIAL/PLATELET - Abnormal; Notable for the following components:   RBC 4.08 (*)    Hemoglobin 11.8 (*)    HCT 37.1 (*)    All other components within normal limits  LACTIC ACID, PLASMA  LACTIC ACID, PLASMA  URINALYSIS, ROUTINE W REFLEX MICROSCOPIC    EKG None  Radiology DG Chest 2 View  Result Date: 01/03/2020 CLINICAL DATA:  Shortness of breath for 2 weeks.  IV drug use. EXAM: CHEST - 2 VIEW COMPARISON:  None. FINDINGS: The heart size and mediastinal contours are within normal limits. The lungs are clear. No pneumothorax or pleural effusion. The visualized skeletal structures are unremarkable. IMPRESSION: No acute cardiopulmonary process. Electronically Signed   By: Emmaline Kluver M.D.   On: 01/03/2020 20:24   DG Foot Complete Left  Result Date: 01/03/2020 CLINICAL DATA:  Injury to the top of the foot.  IV drug use. EXAM: LEFT FOOT - COMPLETE 3+ VIEW COMPARISON:  Left ankle radiographs 10/26/2016 FINDINGS: There is no evidence of fracture or dislocation. There is no evidence of arthropathy or other focal bone abnormality. Mild dorsal soft tissue swelling. IMPRESSION: No acute osseous abnormality in the left foot. Electronically Signed   By: Emmaline Kluver M.D.   On: 01/03/2020 20:26    Procedures Procedures (including critical care time)  Medications Ordered in ED Medications  sodium chloride flush (NS) 0.9 % injection 3 mL (has no administration in time range)  doxycycline (VIBRA-TABS) tablet 100 mg (has no administration in time range)  naproxen (NAPROSYN) tablet 500 mg (has no administration in time range)    ED Course  I have reviewed the triage vital signs and the nursing notes.  Pertinent labs & imaging results that were available during my care of the patient were reviewed by me and considered in my medical decision making (see chart for details).  Clinical Course as of Jan 03 2140  Mon Jan 03, 2020  2139 Labs reviewed.  Mild anemia otherwise no significant abnormalities.   [JK]    Clinical Course User Index [JK] Dorie Rank, MD   MDM Rules/Calculators/A&P                      Patient has history of intravenous drug use.  He does have erythema that may be related to the contusion from the cinderblock however the patient also may have a component of cellulitis from of IV drug use.  I do not see any findings to suggest osteomyelitis or abscess.  The patient's laboratory tests do show a mild anemia but is not having any symptoms of blood loss.  His exam is otherwise reassuring and I do not hear any murmur or other clinical findings to suggest septic arthritis or endocarditis.  Plan on discharge home with a course of antibiotics to cover for cellulitis.  I will also order peer support and give him resources for outpatient assistance with his drug abuse. Final Clinical Impression(s) / ED Diagnoses Final diagnoses:  Contusion of left foot, initial encounter  Cellulitis, unspecified cellulitis site  Anemia, unspecified type    Rx / DC Orders ED Discharge Orders         Ordered    naproxen (NAPROSYN) 500 MG tablet  2 times daily with meals     01/03/20 2141    doxycycline (VIBRA-TABS) 100 MG tablet  2 times daily      01/03/20 2141           Dorie Rank, MD 01/03/20 2141

## 2020-02-04 ENCOUNTER — Other Ambulatory Visit: Payer: Self-pay

## 2020-02-04 ENCOUNTER — Encounter (HOSPITAL_COMMUNITY): Payer: Self-pay

## 2020-02-04 ENCOUNTER — Emergency Department (HOSPITAL_COMMUNITY)
Admission: EM | Admit: 2020-02-04 | Discharge: 2020-02-04 | Disposition: A | Discharge status: Home / Self Care | Payer: Self-pay | Attending: Emergency Medicine | Admitting: Emergency Medicine

## 2020-02-04 DIAGNOSIS — F17210 Nicotine dependence, cigarettes, uncomplicated: Secondary | ICD-10-CM | POA: Insufficient documentation

## 2020-02-04 DIAGNOSIS — L02413 Cutaneous abscess of right upper limb: Secondary | ICD-10-CM | POA: Insufficient documentation

## 2020-02-04 MED ORDER — LIDOCAINE-EPINEPHRINE (PF) 2 %-1:200000 IJ SOLN
10.0000 mL | Freq: Once | INTRAMUSCULAR | Status: AC
  Administered 2020-02-04: 10 mL
  Filled 2020-02-04: qty 20

## 2020-02-04 MED ORDER — OXYCODONE-ACETAMINOPHEN 5-325 MG PO TABS
1.0000 | ORAL_TABLET | Freq: Once | ORAL | Status: AC
  Administered 2020-02-04: 1 via ORAL
  Filled 2020-02-04: qty 1

## 2020-02-04 NOTE — ED Triage Notes (Signed)
Patient has redness and swelling of the right upper arm x 3 days. Patient states that he does use IV Fentanyl and heroin. Patient is also requesting detox from opiates Patient states he last used Iv Fentanyl  yesterday.

## 2020-02-04 NOTE — Discharge Instructions (Addendum)
Please read instructions below.  Keep your wound clean and covered. Soak/flush your wound with warm water, multiple times per day. You can take Advil/ibuprofen every 6 hours as needed for pain. Continue taking the doxycycline as directed until gone. Follow up with your primary care or urgent care for wound recheck in 2 days.  Return to the ER for fever, worsening redness, or new or worsening symptoms.

## 2020-02-04 NOTE — ED Notes (Signed)
Pt left prior to receiving discharge vital signs

## 2020-02-04 NOTE — ED Provider Notes (Signed)
[facility]-EMERGENCY DEPT Provider Note   CSN: [id number] Arrival date & time: 02/04/20  1653     History Chief Complaint  Patient presents with  . Cellulitis  . detox    Ioan Landini is a 44 y.o. male w PMHx hepatitis C, IVDU, EtOH abuse, presenting to the ED with complaint of right arm pain x 3 days. Reports it began getting red, swollen painful. Pain is described as constant, sharp, throbbing. Treated with ibuprofen without much help. He also endorses chills, nausea, tingling in right hand. Denies fever, vomiting, CP, SOB. Last drug use was last night, uses heroin and fentanyl, in left arm.   The history is provided by the patient.       Past Medical History:  Diagnosis Date  . IV drug user     Patient Active Problem List   Diagnosis Date Noted  . Heroin use 06/12/2016  . Polysubstance abuse (HCC) 06/12/2016  . Hepatitis C virus infection 06/12/2016  . ETOH abuse 06/12/2016  . Substance addiction (HCC) 06/18/2011    Past Surgical History:  Procedure Laterality Date  . APPENDECTOMY         Family History  Problem Relation Age of Onset  . Healthy Mother   . Healthy Father     Social History   Tobacco Use  . Smoking status: Current Every Day Smoker    Packs/day: 1.00    Types: Cigarettes  . Smokeless tobacco: Never Used  Vaping Use  . Vaping Use: Never used  Substance Use Topics  . Alcohol use: Not Currently    Comment: weekly  . Drug use: Yes    Types: Marijuana, IV    Comment: heroin and Fentanyl    Home Medications Prior to Admission medications   Medication Sig Start Date End Date Taking? Authorizing Provider  doxycycline (VIBRA-TABS) 100 MG tablet Take 1 tablet (100 mg total) by mouth 2 (two) times daily. 01/03/20   Linwood Dibbles, MD  ibuprofen (ADVIL) 600 MG tablet Take 1 tablet (600 mg total) by mouth every 6 (six) hours as needed. 05/26/19   Liberty Handy, PA-C  naproxen (NAPROSYN) 500 MG tablet Take 1 tablet (500  mg total) by mouth 2 (two) times daily with a meal. As needed for pain 01/03/20   Linwood Dibbles, MD  oxyCODONE (ROXICODONE) 5 MG immediate release tablet Take 1 tablet (5 mg total) by mouth every 6 (six) hours as needed for severe pain. 06/01/19   Melene Plan, MD    Allergies    Patient has no known allergies.  Review of Systems   Review of Systems  All other systems reviewed and are negative.   Physical Exam Updated Vital Signs BP 131/82 (BP Location: Left Arm)   Pulse (!) 103   Temp 98.1 F (36.7 C) (Oral)   Resp 16   Ht 5\' 11"  (1.803 m)   Wt 79.4 kg   SpO2 98%   BMI 24.41 kg/m   Physical Exam Vitals and nursing note reviewed.  Constitutional:      General: He is not in acute distress.    Appearance: He is well-developed. He is not ill-appearing.  HENT:     Head: Normocephalic and atraumatic.  Eyes:     Conjunctiva/sclera: Conjunctivae normal.  Cardiovascular:     Rate and Rhythm: Normal rate and regular rhythm.  Pulmonary:     Effort: Pulmonary effort is normal. No respiratory distress.     Breath sounds: Normal breath sounds.  Abdominal:     Palpations: Abdomen is soft.  Skin:    General: Skin is warm.     Comments: Right distal half of the anterior upper arm with erythema.  The erythema includes the medial aspect of the arm, however is not circumferential.  Erythema does not travel into the forearm.  There is induration and a central area of fluctuance.  See images below.  Neurological:     Mental Status: He is alert.  Psychiatric:        Behavior: Behavior normal.           ED Results / Procedures / Treatments   Labs (all labs ordered are listed, but only abnormal results are displayed) Labs Reviewed - No data to display  EKG None  Radiology No results found.  Procedures .Marland KitchenIncision and Drainage  Date/Time: 02/04/2020 8:52 PM Performed by: Amaru Burroughs, [place] N, PA-C Authorized by: Aaliyan Brinkmeier, [place] N, PA-C   Consent:    Consent obtained:   Verbal   Consent given by:  Patient   Risks discussed:  Bleeding, incomplete drainage and pain   Alternatives discussed:  No treatment Location:    Type:  Abscess   Size:  6cm   Location:  Upper extremity   Upper extremity location:  Arm   Arm location:  R upper arm Pre-procedure details:    Skin preparation:  Chloraprep Anesthesia (see MAR for exact dosages):    Anesthesia method:  Local infiltration   Local anesthetic:  Lidocaine 2% WITH epi Procedure type:    Complexity:  Simple Procedure details:    Needle aspiration: no     Incision types:  Single straight   Incision depth:  Dermal   Scalpel blade:  11   Wound management:  Probed and deloculated and irrigated with saline   Drainage:  Bloody and purulent   Drainage amount:  Copious   Wound treatment:  Wound left open   Packing materials:  None Post-procedure details:    Patient tolerance of procedure:  Tolerated well, no immediate complications   (including critical care time) EMERGENCY DEPARTMENT US SOFT TISSUE INTERPRETATION "Study: Limited Soft Tissue Ultrasound"  INDICATIONS: Pain and Soft tissue infection Multiple views of the body part were obtained in real-time with a multi-frequency linear probe  PERFORMED BY: Myself IMAGES ARCHIVED?: Yes SIDE:Right  BODY PART:Upper extremity INTERPRETATION:  Abcess present and Cellulitis present    Medications Ordered in ED Medications  oxyCODONE-acetaminophen (PERCOCET/ROXICET) 5-325 MG per tablet 1 tablet (1 tablet Oral Given 02/04/20 1906)  lidocaine-EPINEPHrine (XYLOCAINE W/EPI) 2 %-1:200000 (PF) injection 10 mL (10 mLs Infiltration Given 02/04/20 1906)    ED Course  I have reviewed the triage vital signs and the nursing notes.  Pertinent labs & imaging results that were available during my care of the patient were reviewed by me and considered in my medical decision making (see chart for details).    MDM Rules/Calculators/A&P                          Patient  is an IV drug user, presenting with cellulitis and abscess to right upper arm.  No systemic symptoms.  He is afebrile here and well-appearing.  Cellulitis is present to the distal half of the right upper arm, it is not circumferential.  Abscess was amenable to incision and drainage.  It was drained at the bedside with copious purulent drainage.  Patient tolerated procedure well.  He filled a prescription of doxycycline, 100  mg twice daily for 1 week, 2 days ago and has been taking that as directed.  This was a prescription provided to him from a previous ED visit, however he never picked up the prescription.  I recommend he continue taking this as directed until completely gone.  Warm compresses and symptomatic management discussed.  Close follow-up in 2 days for recheck.  Return precautions discussed.  Also consulted peers support as he was requesting resources for substance abuse.  He is well-appearing and agreeable to discharge.   Final Clinical Impression(s) / ED Diagnoses Final diagnoses:  Abscess of right arm    Rx / DC Orders ED Discharge Orders    None       Alexsandra Shontz, [place] N, PA-C 02/04/20 2057    Mancel Bale, MD 02/05/20 [phone]

## 2020-03-27 ENCOUNTER — Other Ambulatory Visit: Payer: Self-pay

## 2020-03-27 ENCOUNTER — Encounter (HOSPITAL_COMMUNITY): Payer: Self-pay | Admitting: Emergency Medicine

## 2020-03-27 ENCOUNTER — Ambulatory Visit (HOSPITAL_COMMUNITY)
Admission: EM | Admit: 2020-03-27 | Discharge: 2020-03-27 | Disposition: A | Discharge status: Home / Self Care | Payer: No Payment, Other | Attending: Behavioral Health | Admitting: Behavioral Health

## 2020-03-27 DIAGNOSIS — F332 Major depressive disorder, recurrent severe without psychotic features: Secondary | ICD-10-CM | POA: Diagnosis not present

## 2020-03-27 DIAGNOSIS — F1910 Other psychoactive substance abuse, uncomplicated: Secondary | ICD-10-CM

## 2020-03-27 DIAGNOSIS — Z20822 Contact with and (suspected) exposure to covid-19: Secondary | ICD-10-CM | POA: Insufficient documentation

## 2020-03-27 DIAGNOSIS — L02413 Cutaneous abscess of right upper limb: Secondary | ICD-10-CM | POA: Insufficient documentation

## 2020-03-27 DIAGNOSIS — R45851 Suicidal ideations: Secondary | ICD-10-CM | POA: Diagnosis not present

## 2020-03-27 DIAGNOSIS — F17210 Nicotine dependence, cigarettes, uncomplicated: Secondary | ICD-10-CM | POA: Insufficient documentation

## 2020-03-27 DIAGNOSIS — Z79899 Other long term (current) drug therapy: Secondary | ICD-10-CM | POA: Insufficient documentation

## 2020-03-27 DIAGNOSIS — F411 Generalized anxiety disorder: Secondary | ICD-10-CM | POA: Insufficient documentation

## 2020-03-27 LAB — CBC WITH DIFFERENTIAL/PLATELET
Abs Immature Granulocytes: 0.04 10*3/uL (ref 0.00–0.07)
Basophils Absolute: 0 10*3/uL (ref 0.0–0.1)
Basophils Relative: 0 %
Eosinophils Absolute: 0.1 10*3/uL (ref 0.0–0.5)
Eosinophils Relative: 1 %
HCT: 37.8 % — ABNORMAL LOW (ref 39.0–52.0)
Hemoglobin: 11.7 g/dL — ABNORMAL LOW (ref 13.0–17.0)
Immature Granulocytes: 0 %
Lymphocytes Relative: 24 %
Lymphs Abs: 2.4 10*3/uL (ref 0.7–4.0)
MCH: 27.5 pg (ref 26.0–34.0)
MCHC: 31 g/dL (ref 30.0–36.0)
MCV: 88.9 fL (ref 80.0–100.0)
Monocytes Absolute: 1.2 10*3/uL — ABNORMAL HIGH (ref 0.1–1.0)
Monocytes Relative: 12 %
Neutro Abs: 6.2 10*3/uL (ref 1.7–7.7)
Neutrophils Relative %: 63 %
Platelets: 323 10*3/uL (ref 150–400)
RBC: 4.25 MIL/uL (ref 4.22–5.81)
RDW: 16.3 % — ABNORMAL HIGH (ref 11.5–15.5)
WBC: 9.9 10*3/uL (ref 4.0–10.5)
nRBC: 0 % (ref 0.0–0.2)

## 2020-03-27 LAB — COMPREHENSIVE METABOLIC PANEL
ALT: 21 U/L (ref 0–44)
AST: 25 U/L (ref 15–41)
Albumin: 3.7 g/dL (ref 3.5–5.0)
Alkaline Phosphatase: 82 U/L (ref 38–126)
Anion gap: 12 (ref 5–15)
BUN: 28 mg/dL — ABNORMAL HIGH (ref 6–20)
CO2: 25 mmol/L (ref 22–32)
Calcium: 9.6 mg/dL (ref 8.9–10.3)
Chloride: 101 mmol/L (ref 98–111)
Creatinine, Ser: 1.21 mg/dL (ref 0.61–1.24)
GFR calc Af Amer: >60 mL/min (ref >60)
GFR calc non Af Amer: >60 mL/min (ref >60)
Glucose, Bld: 97 mg/dL (ref 70–99)
Potassium: 3.8 mmol/L (ref 3.5–5.1)
Sodium: 138 mmol/L (ref 135–145)
Total Bilirubin: 0.4 mg/dL (ref 0.3–1.2)
Total Protein: 8.3 g/dL — ABNORMAL HIGH (ref 6.5–8.1)

## 2020-03-27 LAB — POCT URINE DRUG SCREEN - MANUAL ENTRY (I-SCREEN)
POC Amphetamine UR: POSITIVE — AB
POC Buprenorphine (BUP): NOT DETECTED
POC Cocaine UR: NOT DETECTED
POC Marijuana UR: POSITIVE — AB
POC Methadone UR: NOT DETECTED
POC Methamphetamine UR: POSITIVE — AB
POC Morphine: POSITIVE — AB
POC Oxazepam (BZO): NOT DETECTED
POC Oxycodone UR: NOT DETECTED
POC Secobarbital (BAR): NOT DETECTED

## 2020-03-27 LAB — LIPID PANEL
Cholesterol: 165 mg/dL (ref 0–200)
HDL: 35 mg/dL — ABNORMAL LOW (ref >40)
LDL Cholesterol: 116 mg/dL — ABNORMAL HIGH (ref 0–99)
Total CHOL/HDL Ratio: 4.7 RATIO
Triglycerides: 71 mg/dL (ref <150)
VLDL: 14 mg/dL (ref 0–40)

## 2020-03-27 LAB — ETHANOL: Alcohol, Ethyl (B): <10 mg/dL (ref <10)

## 2020-03-27 LAB — SARS CORONAVIRUS 2 BY RT PCR (HOSPITAL ORDER, PERFORMED IN ~~LOC~~ HOSPITAL LAB): SARS Coronavirus 2: NEGATIVE

## 2020-03-27 LAB — MAGNESIUM: Magnesium: 2.3 mg/dL (ref 1.7–2.4)

## 2020-03-27 LAB — TSH: TSH: 1.305 u[IU]/mL (ref 0.350–4.500)

## 2020-03-27 MED ORDER — CLONIDINE HCL 0.1 MG PO TABS
0.1000 mg | ORAL_TABLET | Freq: Four times a day (QID) | ORAL | Status: DC
  Administered 2020-03-27: 0.1 mg via ORAL
  Filled 2020-03-27: qty 1

## 2020-03-27 MED ORDER — NAPROXEN 500 MG PO TABS: 500.0000 mg | ORAL_TABLET | Freq: Two times a day (BID) | ORAL | Status: DC | PRN

## 2020-03-27 MED ORDER — ACETAMINOPHEN 325 MG PO TABS: 650.0000 mg | ORAL_TABLET | Freq: Four times a day (QID) | ORAL | Status: DC | PRN

## 2020-03-27 MED ORDER — NICOTINE 21 MG/24HR TD PT24
21.0000 mg | MEDICATED_PATCH | Freq: Every day | TRANSDERMAL | Status: DC
  Administered 2020-03-27: 21 mg via TRANSDERMAL
  Filled 2020-03-27: qty 1

## 2020-03-27 MED ORDER — ONDANSETRON 4 MG PO TBDP
4.0000 mg | ORAL_TABLET | Freq: Four times a day (QID) | ORAL | Status: DC | PRN
  Administered 2020-03-27: 4 mg via ORAL
  Filled 2020-03-27: qty 1

## 2020-03-27 MED ORDER — HYDROXYZINE HCL 25 MG PO TABS
25.0000 mg | ORAL_TABLET | Freq: Four times a day (QID) | ORAL | Status: DC | PRN
  Administered 2020-03-27: 25 mg via ORAL

## 2020-03-27 MED ORDER — HYDROXYZINE HCL 25 MG PO TABS
25.0000 mg | ORAL_TABLET | Freq: Four times a day (QID) | ORAL | Status: DC | PRN
  Filled 2020-03-27: qty 1

## 2020-03-27 MED ORDER — DICYCLOMINE HCL 20 MG PO TABS: 20.0000 mg | ORAL_TABLET | Freq: Four times a day (QID) | ORAL | Status: DC | PRN

## 2020-03-27 MED ORDER — CLONIDINE HCL 0.1 MG PO TABS: 0.1000 mg | ORAL_TABLET | Freq: Every day | ORAL | Status: DC

## 2020-03-27 MED ORDER — MAGNESIUM HYDROXIDE 400 MG/5ML PO SUSP: 30.0000 mL | Freq: Every day | ORAL | Status: DC | PRN

## 2020-03-27 MED ORDER — METHOCARBAMOL 500 MG PO TABS
500.0000 mg | ORAL_TABLET | Freq: Three times a day (TID) | ORAL | Status: DC | PRN
  Administered 2020-03-27: 500 mg via ORAL
  Filled 2020-03-27: qty 1

## 2020-03-27 MED ORDER — IBUPROFEN 600 MG PO TABS: 600.0000 mg | ORAL_TABLET | Freq: Four times a day (QID) | ORAL | Status: DC | PRN

## 2020-03-27 MED ORDER — TRAZODONE HCL 50 MG PO TABS: 50.0000 mg | ORAL_TABLET | Freq: Every evening | ORAL | Status: DC | PRN

## 2020-03-27 MED ORDER — LOPERAMIDE HCL 2 MG PO CAPS
2.0000 mg | ORAL_CAPSULE | ORAL | Status: DC | PRN
  Administered 2020-03-27: 2 mg via ORAL
  Filled 2020-03-27: qty 1

## 2020-03-27 MED ORDER — ALUM & MAG HYDROXIDE-SIMETH 200-200-20 MG/5ML PO SUSP: 30.0000 mL | ORAL | Status: DC | PRN

## 2020-03-27 MED ORDER — DOXYCYCLINE HYCLATE 100 MG PO TABS
100.0000 mg | ORAL_TABLET | Freq: Two times a day (BID) | ORAL | Status: DC
  Administered 2020-03-27: 100 mg via ORAL
  Filled 2020-03-27: qty 14
  Filled 2020-03-27: qty 1

## 2020-03-27 MED ORDER — CLONIDINE HCL 0.1 MG PO TABS: 0.1000 mg | ORAL_TABLET | ORAL | Status: DC

## 2020-03-27 NOTE — ED Notes (Signed)
Pt A& O x 4, presents with depression and polysubstance use.  Track marks noted to bilateral arms, abscess/rt forearm noted.  NP Gillermo Murdoch notified and aware.  Skin search completed, monitoring for safety, no distress noted, calm & cooperative.

## 2020-03-27 NOTE — ED Provider Notes (Signed)
Behavioral Health Admission H&P [facility] & OBS)  Date: 03/27/20 Patient Name: Kevin Parker MRN: [id number] Chief Complaint:  Chief Complaint  Patient presents with  . Suicidal   Chief Complaint/Presenting Problem: Pt reported, suicidal and homicidal ideations.  Diagnoses:  Final diagnoses:  Severe episode of recurrent major depressive disorder, without psychotic features (HCC)  Polysubstance abuse (HCC)   HPI: Kevin "Weston Brass" Parker is a 44 year old male who presented to [place] voluntary. The patient is voicing suicidal ideation with plan to hurt himself. The patient voiced that he needs help. He discussed that his mental health is getting in the way of him trying to care for himself. The patient discussed the used of Fentanyl which has caused him a lot. The patient does not appear to be responding to internal or external stimuli. Neither is the patient presenting with any delusional thinking. The patient denies auditory or visual hallucinations. The patient admits to suicidal ideation, but denies homicidal, or self-harm ideations. The patient is not presenting with any psychotic or paranoid behaviors. During an encounter with the patient, he was able to answer questions appropriately.  PHQ 2-9:     Total Time spent with patient: 20 minutes  Musculoskeletal  Strength & Muscle Tone: within normal limits Gait & Station: normal Patient leans: N/A  Psychiatric Specialty Exam  Presentation General Appearance: Disheveled;Appropriate for Environment  Eye Contact:Fair  Speech:Clear and Coherent;Normal Rate  Speech Volume:Normal  Handedness:Right   Mood and Affect  Mood:Anxious  Affect:Congruent;Flat;Depressed   Thought Process  Thought Processes:Coherent;Goal Directed  Descriptions of Associations:Intact  Orientation:Full (Time, Place and Person)  Thought Content:Logical  Hallucinations:Hallucinations: None  Ideas of Reference:None  Suicidal Thoughts:Suicidal  Thoughts: Yes, Passive SI Passive Intent and/or Plan: Without Intent  Homicidal Thoughts:Homicidal Thoughts: No   Sensorium  Memory:Immediate Good;Recent Good;Remote Good  Judgment:Poor  Insight:Lacking   Executive Functions  Concentration:Good  Attention Span:Good  Recall:Good  Fund of Knowledge:Good  Language:Good   Psychomotor Activity  Psychomotor Activity:Psychomotor Activity: Normal   Assets  Assets:Housing;Desire for Improvement;Social Support   Sleep  Sleep:Sleep: Fair Number of Hours of Sleep: -1   Physical Exam Vitals and nursing note reviewed.  Constitutional:      Appearance: Normal appearance.  HENT:     Right Ear: Tympanic membrane normal.     Left Ear: Tympanic membrane normal.     Nose: Nose normal.  Cardiovascular:     Rate and Rhythm: Normal rate.     Pulses: Normal pulses.  Pulmonary:     Effort: Pulmonary effort is normal.  Musculoskeletal:        General: Normal range of motion.     Cervical back: Normal range of motion and neck supple.  Neurological:     General: No focal deficit present.     Mental Status: He is alert and oriented to person, place, and time.  Psychiatric:        Mood and Affect: Mood normal.        Behavior: Behavior normal.    Review of Systems  Skin: Positive for rash.  Psychiatric/Behavioral: Positive for depression and substance abuse. The patient is nervous/anxious and has insomnia.   All other systems reviewed and are negative.   Blood pressure 127/83, pulse 87, temperature 98.3 F (36.8 C), resp. rate 18, SpO2 100 %. There is no height or weight on file to calculate BMI.  Past Psychiatric History: Polysubstance abuse, MDD and anxiety  Is the patient at risk to self? Yes  Has the patient been a  risk to self in the past 6 months? No .    Has the patient been a risk to self within the distant past? No   Is the patient a risk to others? No   Has the patient been a risk to others in the past 6  months? No   Has the patient been a risk to others within the distant past? No   Past Medical History:  Past Medical History:  Diagnosis Date  . IV drug user     Past Surgical History:  Procedure Laterality Date  . APPENDECTOMY      Family History:  Family History  Problem Relation Age of Onset  . Healthy Mother   . Healthy Father     Social History:  Social History   Socioeconomic History  . Marital status: Single    Spouse name: Not on file  . Number of children: Not on file  . Years of education: Not on file  . Highest education level: Not on file  Occupational History  . Not on file  Tobacco Use  . Smoking status: Current Every Day Smoker    Packs/day: 1.00    Types: Cigarettes  . Smokeless tobacco: Never Used  Vaping Use  . Vaping Use: Never used  Substance and Sexual Activity  . Alcohol use: Not Currently    Comment: weekly  . Drug use: Yes    Types: Marijuana, IV    Comment: heroin and Fentanyl  . Sexual activity: Not on file  Other Topics Concern  . Not on file  Social History Narrative  . Not on file   Social Determinants of Health   Financial Resource Strain:   . Difficulty of Paying Living Expenses: Not on file  Food Insecurity:   . Worried About Programme researcher, broadcasting/film/video in the Last Year: Not on file  . Ran Out of Food in the Last Year: Not on file  Transportation Needs:   . Lack of Transportation (Medical): Not on file  . Lack of Transportation (Non-Medical): Not on file  Physical Activity:   . Days of Exercise per Week: Not on file  . Minutes of Exercise per Session: Not on file  Stress:   . Feeling of Stress : Not on file  Social Connections:   . Frequency of Communication with Friends and Family: Not on file  . Frequency of Social Gatherings with Friends and Family: Not on file  . Attends Religious Services: Not on file  . Active Member of Clubs or Organizations: Not on file  . Attends Banker Meetings: Not on file  .  Marital Status: Not on file  Intimate Partner Violence:   . Fear of Current or Ex-Partner: Not on file  . Emotionally Abused: Not on file  . Physically Abused: Not on file  . Sexually Abused: Not on file    SDOH:  SDOH Screenings   Alcohol Screen:   . Last Alcohol Screening Score (AUDIT): Not on file  Depression (PHQ2-9):   . PHQ-2 Score: Not on file  Financial Resource Strain:   . Difficulty of Paying Living Expenses: Not on file  Food Insecurity:   . Worried About Programme researcher, broadcasting/film/video in the Last Year: Not on file  . Ran Out of Food in the Last Year: Not on file  Housing:   . Last Housing Risk Score: Not on file  Physical Activity:   . Days of Exercise per Week: Not on file  . Minutes  of Exercise per Session: Not on file  Social Connections:   . Frequency of Communication with Friends and Family: Not on file  . Frequency of Social Gatherings with Friends and Family: Not on file  . Attends Religious Services: Not on file  . Active Member of Clubs or Organizations: Not on file  . Attends Banker Meetings: Not on file  . Marital Status: Not on file  Stress:   . Feeling of Stress : Not on file  Tobacco Use: High Risk  . Smoking Tobacco Use: Current Every Day Smoker  . Smokeless Tobacco Use: Never Used  Transportation Needs:   . Freight forwarder (Medical): Not on file  . Lack of Transportation (Non-Medical): Not on file    Last Labs:  Admission on 03/27/2020  Component Date Value Ref Range Status  . SARS Coronavirus 2 03/27/2020 NEGATIVE  NEGATIVE Final   Comment: (NOTE) SARS-CoV-2 target nucleic acids are NOT DETECTED.  The SARS-CoV-2 RNA is generally detectable in upper and lower respiratory specimens during the acute phase of infection. The lowest concentration of SARS-CoV-2 viral copies this assay can detect is 250 copies / mL. A negative result does not preclude SARS-CoV-2 infection and should not be used as the sole basis for treatment or  other patient management decisions.  A negative result may occur with improper specimen collection / handling, submission of specimen other than nasopharyngeal swab, presence of viral mutation(s) within the areas targeted by this assay, and inadequate number of viral copies (<250 copies / mL). A negative result must be combined with clinical observations, patient history, and epidemiological information.  Fact Sheet for Patients:   BoilerBrush.com.cy  Fact Sheet for Healthcare Providers: https://pope.com/  This test is not yet approved or                           cleared by the [place] FDA and has been authorized for detection and/or diagnosis of SARS-CoV-2 by FDA under an Emergency Use Authorization (EUA).  This EUA will remain in effect (meaning this test can be used) for the duration of the COVID-19 declaration under Section 564(b)(1) of the Act, 21 U.S.C. section 360bbb-3(b)(1), unless the authorization is terminated or revoked sooner.  Performed at [facility], [address] [place], [place] [address]   . WBC 03/27/2020 9.9  4.0 - 10.5 K/uL Final  . RBC 03/27/2020 4.25  4.22 - 5.81 MIL/uL Final  . Hemoglobin 03/27/2020 11.7* 13.0 - 17.0 g/dL Final  . HCT 98/06/9146 37.8* 39 - 52 % Final  . MCV 03/27/2020 88.9  80.0 - 100.0 fL Final  . MCH 03/27/2020 27.5  26.0 - 34.0 pg Final  . MCHC 03/27/2020 31.0  30.0 - 36.0 g/dL Final  . RDW 82/95/6213 16.3* 11.5 - 15.5 % Final  . Platelets 03/27/2020 323  150 - 400 K/uL Final  . nRBC 03/27/2020 0.0  0.0 - 0.2 % Final  . Neutrophils Relative % 03/27/2020 63  % Final  . Neutro Abs 03/27/2020 6.2  1.7 - 7.7 K/uL Final  . Lymphocytes Relative 03/27/2020 24  % Final  . Lymphs Abs 03/27/2020 2.4  0.7 - 4.0 K/uL Final  . Monocytes Relative 03/27/2020 12  % Final  . Monocytes Absolute 03/27/2020 1.2* 0 - 1 K/uL Final  . Eosinophils Relative 03/27/2020 1  % Final  .  Eosinophils Absolute 03/27/2020 0.1  0 - 0 K/uL Final  . Basophils Relative  03/27/2020 0  % Final  . Basophils Absolute 03/27/2020 0.0  0 - 0 K/uL Final  . Immature Granulocytes 03/27/2020 0  % Final  . Abs Immature Granulocytes 03/27/2020 0.04  0.00 - 0.07 K/uL Final   Performed at [facility], [address], [place], [place] [address]  . Sodium 03/27/2020 138  135 - 145 mmol/L Final  . Potassium 03/27/2020 3.8  3.5 - 5.1 mmol/L Final  . Chloride 03/27/2020 101  98 - 111 mmol/L Final  . CO2 03/27/2020 25  22 - 32 mmol/L Final  . Glucose, Bld 03/27/2020 97  70 - 99 mg/dL Final   Glucose reference range applies only to samples taken after fasting for at least 8 hours.  . BUN 03/27/2020 28* 6 - 20 mg/dL Final  . Creatinine, Ser 03/27/2020 1.21  0.61 - 1.24 mg/dL Final  . Calcium 60/45/4098 9.6  8.9 - 10.3 mg/dL Final  . Total Protein 03/27/2020 8.3* 6.5 - 8.1 g/dL Final  . Albumin 11/91/4782 3.7  3.5 - 5.0 g/dL Final  . AST 95/62/1308 25  15 - 41 U/L Final  . ALT 03/27/2020 21  0 - 44 U/L Final  . Alkaline Phosphatase 03/27/2020 82  38 - 126 U/L Final  . Total Bilirubin 03/27/2020 0.4  0.3 - 1.2 mg/dL Final  . GFR calc non Af Amer 03/27/2020 >60  >60 mL/min Final  . GFR calc Af Amer 03/27/2020 >60  >60 mL/min Final  . Anion gap 03/27/2020 12  5 - 15 Final   Performed at [facility], [address] [place], [place] [address]  . Magnesium 03/27/2020 2.3  1.7 - 2.4 mg/dL Final   Performed at [facility], [address] [place], [place] [address]  . Alcohol, Ethyl (B) 03/27/2020 <10  <10 mg/dL Final   Comment: (NOTE) Lowest detectable limit for serum alcohol is 10 mg/dL.  For medical purposes only. Performed at [facility], [address], [place], [place] [address]   . POC Amphetamine UR 03/27/2020 Positive* None Detected Final  . POC Secobarbital (BAR) 03/27/2020 None Detected  None Detected Final  . POC Buprenorphine (BUP) 03/27/2020 None Detected   None Detected Final  . POC Oxazepam (BZO) 03/27/2020 None Detected  None Detected Final  . POC Cocaine UR 03/27/2020 None Detected  None Detected Final  . POC Methamphetamine UR 03/27/2020 Positive* None Detected Final  . POC Morphine 03/27/2020 Positive* None Detected Final  . POC Oxycodone UR 03/27/2020 None Detected  None Detected Final  . POC Methadone UR 03/27/2020 None Detected  None Detected Final  . POC Marijuana UR 03/27/2020 Positive* None Detected Final  . Cholesterol 03/27/2020 165  0 - 200 mg/dL Final  . Triglycerides 03/27/2020 71  <150 mg/dL Final  . HDL 32/44/0102 35* >40 mg/dL Final  . Total CHOL/HDL Ratio 03/27/2020 4.7  RATIO Final  . VLDL 03/27/2020 14  0 - 40 mg/dL Final  . LDL Cholesterol 03/27/2020 116* 0 - 99 mg/dL Final   Comment:        Total Cholesterol/HDL:CHD Risk Coronary Heart Disease Risk Table                     Men   Women  1/2 Average Risk   3.4   3.3  Average Risk       5.0   4.4  2 X Average Risk   9.6   7.1  3 X Average  Risk  23.4   11.0        Use the calculated Patient Ratio above and the CHD Risk Table to determine the patient's CHD Risk.        ATP III CLASSIFICATION (LDL):  <100     mg/dL   Optimal  [id number]  mg/dL   Near or Above                    Optimal  130-159  mg/dL   Borderline  [id number]  mg/dL   High  >782>190     mg/dL   Very High Performed at [facility], [address] [place], [place] [address]   Admission on 01/03/2020, Discharged on 01/03/2020  Component Date Value Ref Range Status  . Lactic Acid, Venous 01/03/2020 1.1  0.5 - 1.9 mmol/L Final   Performed at [facility], [address] [place], [place] [address]  . Sodium 01/03/2020 143  135 - 145 mmol/L Final  . Potassium 01/03/2020 4.1  3.5 - 5.1 mmol/L Final  . Chloride 01/03/2020 106  98 - 111 mmol/L Final  . CO2 01/03/2020 28  22 - 32 mmol/L Final  . Glucose, Bld 01/03/2020 104* 70 - 99 mg/dL Final   Glucose reference range applies only  to samples taken after fasting for at least 8 hours.  . BUN 01/03/2020 23* 6 - 20 mg/dL Final  . Creatinine, Ser 01/03/2020 0.83  0.61 - 1.24 mg/dL Final  . Calcium 78/46/962905/31/2021 8.7* 8.9 - 10.3 mg/dL Final  . Total Protein 01/03/2020 7.3  6.5 - 8.1 g/dL Final  . Albumin 52/84/132405/31/2021 3.7  3.5 - 5.0 g/dL Final  . AST 40/10/272505/31/2021 20  15 - 41 U/L Final  . ALT 01/03/2020 18  0 - 44 U/L Final  . Alkaline Phosphatase 01/03/2020 82  38 - 126 U/L Final  . Total Bilirubin 01/03/2020 0.3  0.3 - 1.2 mg/dL Final  . GFR calc non Af Amer 01/03/2020 >60  >60 mL/min Final  . GFR calc Af Amer 01/03/2020 >60  >60 mL/min Final  . Anion gap 01/03/2020 9  5 - 15 Final   Performed at [facility], [address] [place], [place] [address]  . WBC 01/03/2020 8.4  4.0 - 10.5 K/uL Final  . RBC 01/03/2020 4.08* 4.22 - 5.81 MIL/uL Final  . Hemoglobin 01/03/2020 11.8* 13.0 - 17.0 g/dL Final  . HCT 03/47/425905/31/2021 37.1* 39 - 52 % Final  . MCV 01/03/2020 90.9  80.0 - 100.0 fL Final  . MCH 01/03/2020 28.9  26.0 - 34.0 pg Final  . MCHC 01/03/2020 31.8  30.0 - 36.0 g/dL Final  . RDW 56/38/756405/31/2021 14.7  11.5 - 15.5 % Final  . Platelets 01/03/2020 247  150 - 400 K/uL Final  . nRBC 01/03/2020 0.0  0.0 - 0.2 % Final  . Neutrophils Relative % 01/03/2020 69  % Final  . Neutro Abs 01/03/2020 5.8  1.7 - 7.7 K/uL Final  . Lymphocytes Relative 01/03/2020 20  % Final  . Lymphs Abs 01/03/2020 1.7  0.7 - 4.0 K/uL Final  . Monocytes Relative 01/03/2020 10  % Final  . Monocytes Absolute 01/03/2020 0.8  0 - 1 K/uL Final  . Eosinophils Relative 01/03/2020 1  % Final  . Eosinophils Absolute 01/03/2020 0.1  0 - 0 K/uL Final  . Basophils Relative 01/03/2020 0  % Final  . Basophils Absolute 01/03/2020 0.0  0 - 0 K/uL Final  .  Immature Granulocytes 01/03/2020 0  % Final  . Abs Immature Granulocytes 01/03/2020 0.02  0.00 - 0.07 K/uL Final   Performed at [facility], [address], [place], [place] [address]     Allergies: Patient has no known allergies.  PTA Medications: (Not in a hospital admission)   Medical Decision Making     Recommendations  Based on my evaluation the patient does not appear to have an emergency medical condition. The patient will remain under observation overnight and reassess in the A.M. to determine if she meets the criteria for psychiatric inpatient admission or can be discharged. Gillermo Murdoch, NP 03/27/20  5:58 AM

## 2020-03-27 NOTE — ED Notes (Signed)
RN assessments completed with LPN present.

## 2020-03-27 NOTE — ED Notes (Signed)
Belongings gave to pt. Sample meds and avs given and went over. Pt showed understanding by repeating back info. Escorted to front door. Stable at time of d/c
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
## 2020-03-27 NOTE — Consult Note (Signed)
WOC Nurse Consult Note:  Patient receiving care in [facility], Behavioral urgent care Reason for Consult: RFA abscess Wound type: infectious I spoke with George Hugh, primary RN via telephone, and Tavis Money, NP via telephone and Secure Chat. From the description Matt provided, there is an indurated area on the RFA that is TTP and warm to touch, but no open wound.  This requires medical management and exceeds the scope of practice of the WOC nurse. The patient was not seen, and the [facility] nurse will not follow. Helmut Muster, RN, MSN, CWOCN, CNS-BC, pager [phone]

## 2020-03-27 NOTE — ED Notes (Signed)
Wallet and belt in locker 29

## 2020-03-27 NOTE — ED Notes (Signed)
Pt resting with eyes closed. Rise and fall of chest noted. Will continue to monitor for safety 

## 2020-03-27 NOTE — ED Triage Notes (Signed)
Presents with depression and polysubstance abuse.

## 2020-03-27 NOTE — ED Provider Notes (Signed)
FBC/OBS ASAP Discharge Summary  Date and Time: 03/27/2020 2:13 PM  Name: Kevin Parker  MRN:  [id number]   Discharge Diagnoses:  Final diagnoses:  Severe episode of recurrent major depressive disorder, without psychotic features (HCC)  Polysubstance abuse (HCC)    Subjective: Patient reports today that he is hoping to get into substance abuse treatment program.  He denies any suicidal homicidal ideations and denies any hallucinations.  He does report that he has been using fentanyl since approximately Christmas of last year.  He states he did use amphetamines 1 time earlier this week.  He reports a history of generalized anxiety disorder.  He also reports going to [facility] twice in the past once in 2018 and once in 2020.  Patient does request to have some antibiotics for his abscess on his right forearm.  Patient states he was given doxycycline the last time he was in the ED but the one that he had before was a lot lot worse and it had to be drained.  Patient was informed that the only substance abuse treatment manager [facility] [place] and the patient stated he was not going to go there.  He stated he would rather discharge and go stay with one of his friends that live just down the road.  Stay Summary: Patient is a 44 year old male who presented to the [facility] voluntarily.  Patient presented voicing suicidal ideation with a plan to hurt himself but no specifics.  Patient was admitted to the continuous observation unit and was placed on detox protocol due to reports of fentanyl abuse.  Patient maintained overnight and was monitored.  This morning patient is requesting to go to substance abuse treatment but when informed of the only beds available at this time was at the [facility] with the patient stated he did not want to go to [place].  Patient requested to discharge and continued to deny any suicidal homicidal ideations and denies hallucinations today.  Patient denied  having any withdrawal symptoms at this time.  At this time the patient does not meet inpatient psychiatric treatment criteria and is psychiatrically cleared.  Total Time spent with patient: 30 minutes  Past Psychiatric History: MDD severe, polysubstance abuse, heroin use, ETOH abuse Past Medical History:  Past Medical History:  Diagnosis Date  . IV drug user     Past Surgical History:  Procedure Laterality Date  . APPENDECTOMY     Family History:  Family History  Problem Relation Age of Onset  . Healthy Mother   . Healthy Father    Family Psychiatric History: None reported Social History:  Social History   Substance and Sexual Activity  Alcohol Use Not Currently   Comment: weekly     Social History   Substance and Sexual Activity  Drug Use Yes  . Types: Marijuana, IV   Comment: heroin and Fentanyl    Social History   Socioeconomic History  . Marital status: Single    Spouse name: Not on file  . Number of children: Not on file  . Years of education: Not on file  . Highest education level: Not on file  Occupational History  . Not on file  Tobacco Use  . Smoking status: Current Every Day Smoker    Packs/day: 1.00    Types: Cigarettes  . Smokeless tobacco: Never Used  Vaping Use  . Vaping Use: Never used  Substance and Sexual Activity  . Alcohol use: Not Currently  Comment: weekly  . Drug use: Yes    Types: Marijuana, IV    Comment: heroin and Fentanyl  . Sexual activity: Not on file  Other Topics Concern  . Not on file  Social History Narrative  . Not on file   Social Determinants of Health   Financial Resource Strain:   . Difficulty of Paying Living Expenses: Not on file  Food Insecurity:   . Worried About Programme researcher, broadcasting/film/video in the Last Year: Not on file  . Ran Out of Food in the Last Year: Not on file  Transportation Needs:   . Lack of Transportation (Medical): Not on file  . Lack of Transportation (Non-Medical): Not on file  Physical  Activity:   . Days of Exercise per Week: Not on file  . Minutes of Exercise per Session: Not on file  Stress:   . Feeling of Stress : Not on file  Social Connections:   . Frequency of Communication with Friends and Family: Not on file  . Frequency of Social Gatherings with Friends and Family: Not on file  . Attends Religious Services: Not on file  . Active Member of Clubs or Organizations: Not on file  . Attends Banker Meetings: Not on file  . Marital Status: Not on file   SDOH:  SDOH Screenings   Alcohol Screen:   . Last Alcohol Screening Score (AUDIT): Not on file  Depression (PHQ2-9):   . PHQ-2 Score: Not on file  Financial Resource Strain:   . Difficulty of Paying Living Expenses: Not on file  Food Insecurity:   . Worried About Programme researcher, broadcasting/film/video in the Last Year: Not on file  . Ran Out of Food in the Last Year: Not on file  Housing:   . Last Housing Risk Score: Not on file  Physical Activity:   . Days of Exercise per Week: Not on file  . Minutes of Exercise per Session: Not on file  Social Connections:   . Frequency of Communication with Friends and Family: Not on file  . Frequency of Social Gatherings with Friends and Family: Not on file  . Attends Religious Services: Not on file  . Active Member of Clubs or Organizations: Not on file  . Attends Banker Meetings: Not on file  . Marital Status: Not on file  Stress:   . Feeling of Stress : Not on file  Tobacco Use: High Risk  . Smoking Tobacco Use: Current Every Day Smoker  . Smokeless Tobacco Use: Never Used  Transportation Needs:   . Freight forwarder (Medical): Not on file  . Lack of Transportation (Non-Medical): Not on file    Has this patient used any form of tobacco in the last 30 days? (Cigarettes, Smokeless Tobacco, Cigars, and/or Pipes) A prescription for an FDA-approved tobacco cessation medication was offered at discharge and the patient refused  Current Medications:   Current Facility-Administered Medications  Medication Dose Route Frequency Provider Last Rate Last Admin  . acetaminophen (TYLENOL) tablet 650 mg  650 mg Oral Q6H PRN Gillermo Murdoch, NP      . alum & mag hydroxide-simeth (MAALOX/MYLANTA) 200-200-20 MG/5ML suspension 30 mL  30 mL Oral Q4H PRN Gillermo Murdoch, NP      . cloNIDine (CATAPRES) tablet 0.1 mg  0.1 mg Oral QID Eola Waldrep, Feliz Beam B, FNP   0.1 mg at 03/27/20 1202   Followed by  . [START ON 03/29/2020] cloNIDine (CATAPRES) tablet 0.1 mg  0.1 mg Oral  [facility] Zenora Karpel, Gerlene Burdock, FNP       Followed by  . [START ON 03/31/2020] cloNIDine (CATAPRES) tablet 0.1 mg  0.1 mg Oral QAC breakfast Iriana Artley, Gerlene Burdock, FNP      . dicyclomine (BENTYL) tablet 20 mg  20 mg Oral Q6H PRN Savahna Casados, Gerlene Burdock, FNP      . doxycycline (VIBRA-TABS) tablet 100 mg  100 mg Oral Q12H Bawi Lakins B, FNP   100 mg at 03/27/20 1203  . hydrOXYzine (ATARAX/VISTARIL) tablet 25 mg  25 mg Oral Q6H PRN Gillermo Murdoch, NP      . hydrOXYzine (ATARAX/VISTARIL) tablet 25 mg  25 mg Oral Q6H PRN Aarib Pulido, Gerlene Burdock, FNP   25 mg at 03/27/20 1202  . ibuprofen (ADVIL) tablet 600 mg  600 mg Oral Q6H PRN Gillermo Murdoch, NP      . loperamide (IMODIUM) capsule 2-4 mg  2-4 mg Oral PRN Chanah Tidmore, Gerlene Burdock, FNP   2 mg at 03/27/20 1202  . magnesium hydroxide (MILK OF MAGNESIA) suspension 30 mL  30 mL Oral Daily PRN Gillermo Murdoch, NP      . methocarbamol (ROBAXIN) tablet 500 mg  500 mg Oral Q8H PRN Roshanna Cimino, Gerlene Burdock, FNP   500 mg at 03/27/20 1201  . naproxen (NAPROSYN) tablet 500 mg  500 mg Oral BID PRN Tajanay Hurley, Gerlene Burdock, FNP      . nicotine (NICODERM CQ - dosed in mg/24 hours) patch 21 mg  21 mg Transdermal Daily Mats Jeanlouis, Feliz Beam B, FNP   21 mg at 03/27/20 1204  . ondansetron (ZOFRAN-ODT) disintegrating tablet 4 mg  4 mg Oral Q6H PRN Milen Lengacher, Gerlene Burdock, FNP   4 mg at 03/27/20 1203  . traZODone (DESYREL) tablet 50 mg  50 mg Oral QHS PRN Gillermo Murdoch, NP       Current Outpatient  Medications  Medication Sig Dispense Refill  . acetaminophen (TYLENOL) 500 MG tablet Take 1,000 mg by mouth every 6 (six) hours as needed for moderate pain.    Marland Kitchen doxycycline (VIBRA-TABS) 100 MG tablet Take 1 tablet (100 mg total) by mouth 2 (two) times daily. (Patient not taking: Reported on 03/27/2020) 14 tablet 0  . ibuprofen (ADVIL) 600 MG tablet Take 1 tablet (600 mg total) by mouth every 6 (six) hours as needed. (Patient not taking: Reported on 03/27/2020) 30 tablet 0  . naproxen (NAPROSYN) 500 MG tablet Take 1 tablet (500 mg total) by mouth 2 (two) times daily with a meal. As needed for pain (Patient not taking: Reported on 03/27/2020) 20 tablet 0  . oxyCODONE (ROXICODONE) 5 MG immediate release tablet Take 1 tablet (5 mg total) by mouth every 6 (six) hours as needed for severe pain. (Patient not taking: Reported on 03/27/2020) 5 tablet 0    PTA Medications: (Not in a hospital admission)   Musculoskeletal  Strength & Muscle Tone: within normal limits Gait & Station: normal Patient leans: N/A  Psychiatric Specialty Exam  Presentation  General Appearance: Appropriate for Environment;Casual  Eye Contact:Good  Speech:Clear and Coherent;Normal Rate  Speech Volume:Normal  Handedness:Right   Mood and Affect  Mood:Euthymic  Affect:Appropriate;Congruent   Thought Process  Thought Processes:Coherent  Descriptions of Associations:Intact  Orientation:Full (Time, Place and Person)  Thought Content:WDL  Hallucinations:Hallucinations: None  Ideas of Reference:None  Suicidal Thoughts:Suicidal Thoughts: No SI Passive Intent and/or Plan: Without Intent  Homicidal Thoughts:Homicidal Thoughts: No   Sensorium  Memory:Immediate Good;Recent Good;Remote Good  Judgment:Fair  Insight:Fair   Executive Functions  Concentration:Good  Attention Span:Good  Recall:Good  Fund of Knowledge:Good  Language:Good   Psychomotor Activity  Psychomotor Activity:Psychomotor  Activity: Normal   Assets  Assets:Communication Skills;Desire for Improvement   Sleep  Sleep:Sleep: Good Number of Hours of Sleep: -1   Physical Exam  Physical Exam Vitals and nursing note reviewed.  Constitutional:      Appearance: He is well-developed.  Cardiovascular:     Rate and Rhythm: Normal rate.  Pulmonary:     Effort: Pulmonary effort is normal.  Musculoskeletal:        General: Normal range of motion.  Skin:    General: Skin is warm.  Neurological:     Mental Status: He is alert and oriented to person, place, and time.    Review of Systems  Constitutional: Negative.   HENT: Negative.   Eyes: Negative.   Respiratory: Negative.   Cardiovascular: Negative.   Gastrointestinal: Negative.   Genitourinary: Negative.   Musculoskeletal: Negative.   Skin: Negative.   Neurological: Negative.   Endo/Heme/Allergies: Negative.   Psychiatric/Behavioral: Positive for substance abuse.   Blood pressure 132/89, pulse 77, temperature 98.4 F (36.9 C), temperature source Oral, resp. rate 18, SpO2 100 %. There is no height or weight on file to calculate BMI.  Demographic Factors:  Male, Caucasian and Low socioeconomic status  Loss Factors: NA  Historical Factors: NA  Risk Reduction Factors:   Living with another person, especially a relative, Positive social support and Positive therapeutic relationship  Continued Clinical Symptoms:  Alcohol/Substance Abuse/Dependencies  Cognitive Features That Contribute To Risk:  None    Suicide Risk:  Mild:  Suicidal ideation of limited frequency, intensity, duration, and specificity.  There are no identifiable plans, no associated intent, mild dysphoria and related symptoms, good self-control (both objective and subjective assessment), few other risk factors, and identifiable protective factors, including available and accessible social support.  Plan Of Care/Follow-up recommendations:  Continue activity as tolerated.  Continue diet as recommended by your PCP. Ensure to keep all appointments with outpatient providers.  Disposition: Discharge home to live with friend  Maryfrances Bunnellravis B Hinata Diener, FNP 03/27/2020, 2:13 PM

## 2020-03-27 NOTE — Discharge Instructions (Addendum)
Follow up with outpatient resources Take medications as prescribed

## 2020-03-28 ENCOUNTER — Other Ambulatory Visit: Payer: Self-pay

## 2020-03-28 ENCOUNTER — Encounter (HOSPITAL_COMMUNITY): Payer: Self-pay

## 2020-03-28 DIAGNOSIS — L03113 Cellulitis of right upper limb: Principal | ICD-10-CM | POA: Diagnosis present

## 2020-03-28 DIAGNOSIS — F1510 Other stimulant abuse, uncomplicated: Secondary | ICD-10-CM | POA: Diagnosis present

## 2020-03-28 DIAGNOSIS — F329 Major depressive disorder, single episode, unspecified: Secondary | ICD-10-CM | POA: Diagnosis present

## 2020-03-28 DIAGNOSIS — Z9049 Acquired absence of other specified parts of digestive tract: Secondary | ICD-10-CM

## 2020-03-28 DIAGNOSIS — B1920 Unspecified viral hepatitis C without hepatic coma: Secondary | ICD-10-CM | POA: Diagnosis present

## 2020-03-28 DIAGNOSIS — F17210 Nicotine dependence, cigarettes, uncomplicated: Secondary | ICD-10-CM | POA: Diagnosis present

## 2020-03-28 DIAGNOSIS — F1190 Opioid use, unspecified, uncomplicated: Secondary | ICD-10-CM | POA: Diagnosis present

## 2020-03-28 DIAGNOSIS — Z20822 Contact with and (suspected) exposure to covid-19: Secondary | ICD-10-CM | POA: Diagnosis present

## 2020-03-28 LAB — CBC WITH DIFFERENTIAL/PLATELET
Abs Immature Granulocytes: 0.05 10*3/uL (ref 0.00–0.07)
Basophils Absolute: 0.1 10*3/uL (ref 0.0–0.1)
Basophils Relative: 1 %
Eosinophils Absolute: 0.1 10*3/uL (ref 0.0–0.5)
Eosinophils Relative: 1 %
HCT: 36.9 % — ABNORMAL LOW (ref 39.0–52.0)
Hemoglobin: 11.5 g/dL — ABNORMAL LOW (ref 13.0–17.0)
Immature Granulocytes: 1 %
Lymphocytes Relative: 23 %
Lymphs Abs: 2.6 10*3/uL (ref 0.7–4.0)
MCH: 28.2 pg (ref 26.0–34.0)
MCHC: 31.2 g/dL (ref 30.0–36.0)
MCV: 90.4 fL (ref 80.0–100.0)
Monocytes Absolute: 1.2 10*3/uL — ABNORMAL HIGH (ref 0.1–1.0)
Monocytes Relative: 11 %
Neutro Abs: 7.1 10*3/uL (ref 1.7–7.7)
Neutrophils Relative %: 63 %
Platelets: 313 10*3/uL (ref 150–400)
RBC: 4.08 MIL/uL — ABNORMAL LOW (ref 4.22–5.81)
RDW: 16.1 % — ABNORMAL HIGH (ref 11.5–15.5)
WBC: 11.1 10*3/uL — ABNORMAL HIGH (ref 4.0–10.5)
nRBC: 0 % (ref 0.0–0.2)

## 2020-03-28 LAB — PROLACTIN: Prolactin: 2.4 ng/mL — ABNORMAL LOW (ref 4.0–15.2)

## 2020-03-28 LAB — COMPREHENSIVE METABOLIC PANEL
ALT: 21 U/L (ref 0–44)
AST: 23 U/L (ref 15–41)
Albumin: 4.2 g/dL (ref 3.5–5.0)
Alkaline Phosphatase: 77 U/L (ref 38–126)
Anion gap: 10 (ref 5–15)
BUN: 27 mg/dL — ABNORMAL HIGH (ref 6–20)
CO2: 28 mmol/L (ref 22–32)
Calcium: 9.3 mg/dL (ref 8.9–10.3)
Chloride: 101 mmol/L (ref 98–111)
Creatinine, Ser: 0.93 mg/dL (ref 0.61–1.24)
GFR calc Af Amer: >60 mL/min (ref >60)
GFR calc non Af Amer: >60 mL/min (ref >60)
Glucose, Bld: 95 mg/dL (ref 70–99)
Potassium: 4.4 mmol/L (ref 3.5–5.1)
Sodium: 139 mmol/L (ref 135–145)
Total Bilirubin: 0.6 mg/dL (ref 0.3–1.2)
Total Protein: 8.3 g/dL — ABNORMAL HIGH (ref 6.5–8.1)

## 2020-03-28 LAB — LACTIC ACID, PLASMA: Lactic Acid, Venous: 0.6 mmol/L (ref 0.5–1.9)

## 2020-03-28 NOTE — ED Triage Notes (Signed)
Pt complains of his right arm being swollen, red and tight for three days, he had to have pressure released from cellulitis by [place] before on the same arm Pt admits to using needles in that arm Pt has been taking antibiotics that he received from [facility]

## 2020-03-29 ENCOUNTER — Inpatient Hospital Stay (HOSPITAL_COMMUNITY): Payer: Self-pay

## 2020-03-29 ENCOUNTER — Inpatient Hospital Stay (HOSPITAL_COMMUNITY)
Admission: EM | Admit: 2020-03-29 | Discharge: 2020-03-31 | DRG: 603 | Disposition: A | Discharge status: Home / Self Care | Payer: Self-pay | Attending: Internal Medicine | Admitting: Internal Medicine

## 2020-03-29 ENCOUNTER — Emergency Department (HOSPITAL_COMMUNITY): Payer: Self-pay

## 2020-03-29 DIAGNOSIS — I38 Endocarditis, valve unspecified: Secondary | ICD-10-CM

## 2020-03-29 DIAGNOSIS — F1990 Other psychoactive substance use, unspecified, uncomplicated: Secondary | ICD-10-CM

## 2020-03-29 DIAGNOSIS — F1190 Opioid use, unspecified, uncomplicated: Secondary | ICD-10-CM | POA: Diagnosis present

## 2020-03-29 DIAGNOSIS — B1920 Unspecified viral hepatitis C without hepatic coma: Secondary | ICD-10-CM | POA: Diagnosis present

## 2020-03-29 DIAGNOSIS — F1510 Other stimulant abuse, uncomplicated: Secondary | ICD-10-CM

## 2020-03-29 DIAGNOSIS — L03113 Cellulitis of right upper limb: Secondary | ICD-10-CM | POA: Diagnosis present

## 2020-03-29 DIAGNOSIS — Z789 Other specified health status: Secondary | ICD-10-CM

## 2020-03-29 DIAGNOSIS — F1920 Other psychoactive substance dependence, uncomplicated: Secondary | ICD-10-CM | POA: Diagnosis present

## 2020-03-29 DIAGNOSIS — F1910 Other psychoactive substance abuse, uncomplicated: Secondary | ICD-10-CM | POA: Diagnosis present

## 2020-03-29 LAB — SARS CORONAVIRUS 2 BY RT PCR (HOSPITAL ORDER, PERFORMED IN ~~LOC~~ HOSPITAL LAB): SARS Coronavirus 2: NEGATIVE

## 2020-03-29 LAB — ECHOCARDIOGRAM COMPLETE
Area-P 1/2: 2.66 cm2
S' Lateral: 3.4 cm

## 2020-03-29 MED ORDER — ENOXAPARIN SODIUM 40 MG/0.4ML ~~LOC~~ SOLN
40.0000 mg | SUBCUTANEOUS | Status: DC
  Administered 2020-03-29 – 2020-03-30 (×2): 40 mg via SUBCUTANEOUS
  Filled 2020-03-29 (×2): qty 0.4

## 2020-03-29 MED ORDER — SODIUM CHLORIDE 0.9 % IV SOLN
2.0000 g | Freq: Three times a day (TID) | INTRAVENOUS | Status: DC
  Administered 2020-03-29 – 2020-03-31 (×6): 2 g via INTRAVENOUS
  Filled 2020-03-29: qty 0.33
  Filled 2020-03-29 (×6): qty 2

## 2020-03-29 MED ORDER — TRAMADOL HCL 50 MG PO TABS
50.0000 mg | ORAL_TABLET | Freq: Three times a day (TID) | ORAL | Status: DC | PRN
  Administered 2020-03-29: 17:00:00 50 mg via ORAL
  Filled 2020-03-29: qty 1

## 2020-03-29 MED ORDER — IOHEXOL 300 MG/ML  SOLN
100.0000 mL | Freq: Once | INTRAMUSCULAR | Status: AC | PRN
  Administered 2020-03-29: 100 mL via INTRAVENOUS

## 2020-03-29 MED ORDER — VANCOMYCIN HCL IN DEXTROSE 1-5 GM/200ML-% IV SOLN: 1000.0000 mg | Freq: Once | INTRAVENOUS | Status: DC

## 2020-03-29 MED ORDER — VANCOMYCIN HCL 1500 MG/300ML IV SOLN
1500.0000 mg | Freq: Once | INTRAVENOUS | Status: AC
  Administered 2020-03-29: 1500 mg via INTRAVENOUS
  Filled 2020-03-29: qty 300

## 2020-03-29 MED ORDER — TRAZODONE HCL 50 MG PO TABS
50.0000 mg | ORAL_TABLET | Freq: Every evening | ORAL | Status: AC | PRN
  Administered 2020-03-29: 50 mg via ORAL
  Filled 2020-03-29: qty 1

## 2020-03-29 MED ORDER — VANCOMYCIN HCL IN DEXTROSE 1-5 GM/200ML-% IV SOLN
1000.0000 mg | Freq: Three times a day (TID) | INTRAVENOUS | Status: DC
  Administered 2020-03-29 – 2020-03-31 (×5): 1000 mg via INTRAVENOUS
  Filled 2020-03-29 (×6): qty 200

## 2020-03-29 MED ORDER — HYDROCODONE-ACETAMINOPHEN 5-325 MG PO TABS
1.0000 | ORAL_TABLET | Freq: Once | ORAL | Status: AC
  Administered 2020-03-29: 1 via ORAL
  Filled 2020-03-29: qty 1

## 2020-03-29 NOTE — Progress Notes (Signed)
  Echocardiogram 2D Echocardiogram has been performed.  Celene Skeen 03/29/2020, 1:54 PM

## 2020-03-29 NOTE — Progress Notes (Signed)
Pharmacy Antibiotic Note  Kevin Parker is a 44 y.o. male admitted on 03/29/2020 with ongoing IV drug abuse with history of recurrent arm cellulitis.  Failed outpatient Doxycycline.  Pharmacy has been consulted for Vancomycin dosing.  Plan: Cefepime 2g IV q8h per MD Vancomycin 1500mg  IV x1 then 1g IV q8h Measure Vanc trough at steady state. Follow up renal function, culture results, and clinical course.    Height: 5\' 11"  (180.3 cm) Weight: 77.1 kg (170 lb) IBW/kg (Calculated) : 75.3  Temp (24hrs), Avg:98.3 F (36.8 C), Min:98.3 F (36.8 C), Max:98.3 F (36.8 C)  Recent Labs  Lab 03/27/20 0358 03/28/20 2213  WBC 9.9 11.1*  CREATININE 1.21 0.93  LATICACIDVEN  --  0.6    Estimated Creatinine Clearance: 110.2 mL/min (by C-G formula based on SCr of 0.93 mg/dL).    No Known Allergies  Antimicrobials this admission: 8/25 Vancomycin >>  8/25 Cefepime >>   Dose adjustments this admission:   Microbiology results: 8/25 COVID: neg 8/25 BCx:    Thank you for allowing pharmacy to be a part of this patient's care.  9/25 PharmD, BCPS Clinical Pharmacist [facility] main pharmacy [phone] 03/29/2020 12:25 PM

## 2020-03-29 NOTE — ED Notes (Signed)
1 set of cultures collected. Unable to collectd the 2nd due to right arm restricted from cellulitis.

## 2020-03-29 NOTE — ED Provider Notes (Signed)
Kevin Parker Provider Note   CSN: [id number] Arrival date & time: 03/28/20  2119     History No chief complaint on file.   [name] is a 44 y.o. male.  HPI   Patient is a 44 year old male with history of MDD, polysubstance abuse, hepatitis C, IVDU.  Patient presents today with 5 days of worsening right medial forearm pain and swelling.  Patient states he was injecting heroin 5 days ago and missed his vein resulting in his pain.  He reports a history of similar symptoms.  He was seen at behavioral health earlier this week and was prescribed doxycycline twice daily which he has been taking for 2-1/2 days without relief.  He states that over the last 24 hours the swelling and pain in his arm has significantly worsened.  No fevers, chills, URI symptoms, chest pain, shortness of breath, nausea, vomiting.     Past Medical History:  Diagnosis Date  . IV drug user     Patient Active Problem List   Diagnosis Date Noted  . Severe episode of recurrent major depressive disorder, without psychotic features (HCC) 03/27/2020  . Heroin use 06/12/2016  . Polysubstance abuse (HCC) 06/12/2016  . Hepatitis C virus infection 06/12/2016  . ETOH abuse 06/12/2016  . Substance addiction (HCC) 06/18/2011    Past Surgical History:  Procedure Laterality Date  . APPENDECTOMY         Family History  Problem Relation Age of Onset  . Healthy Mother   . Healthy Father     Social History   Tobacco Use  . Smoking status: Current Every Day Smoker    Packs/day: 1.00    Types: Cigarettes  . Smokeless tobacco: Never Used  Vaping Use  . Vaping Use: Never used  Substance Use Topics  . Alcohol use: Not Currently    Comment: weekly  . Drug use: Yes    Types: Marijuana, IV    Comment: heroin and Fentanyl    Home Medications Prior to Admission medications   Medication Sig Start Date End Date Taking? Authorizing Provider  acetaminophen (TYLENOL) 500 MG  tablet Take 1,000 mg by mouth every 6 (six) hours as needed for moderate pain.    [provider]  doxycycline (VIBRA-TABS) 100 MG tablet Take 1 tablet (100 mg total) by mouth 2 (two) times daily. Patient not taking: Reported on 03/27/2020 01/03/20   Kevin Dibbles, MD  ibuprofen (ADVIL) 600 MG tablet Take 1 tablet (600 mg total) by mouth every 6 (six) hours as needed. Patient not taking: Reported on 03/27/2020 05/26/19   Kevin Handy, PA-C  naproxen (NAPROSYN) 500 MG tablet Take 1 tablet (500 mg total) by mouth 2 (two) times daily with a meal. As needed for pain Patient not taking: Reported on 03/27/2020 01/03/20   Kevin Dibbles, MD  oxyCODONE (ROXICODONE) 5 MG immediate release tablet Take 1 tablet (5 mg total) by mouth every 6 (six) hours as needed for severe pain. Patient not taking: Reported on 03/27/2020 06/01/19   Kevin Plan, MD    Allergies    Patient has no known allergies.  Review of Systems   Review of Systems  All other systems reviewed and are negative. Ten systems reviewed and are negative for acute change, except as noted in the HPI.   Physical Exam Updated Vital Signs BP 134/87   Pulse 82   Temp 98.3 F (36.8 C) (Oral)   Resp 15   Ht 5\' 11"  (1.803 m)  Wt 77.1 kg   SpO2 99%   BMI 23.71 kg/m   Physical Exam Vitals and nursing note reviewed.  Constitutional:      General: He is not in acute distress.    Appearance: Normal appearance. He is not ill-appearing, toxic-appearing or diaphoretic.     Comments: Well developed male lying in semi fowlers position. Sleeping comfortably in bed.   HENT:     Head: Normocephalic and atraumatic.     Right Ear: External ear normal.     Left Ear: External ear normal.     Nose: Nose normal.     Mouth/Throat:     Mouth: Mucous membranes are moist.     Pharynx: Oropharynx is clear. No oropharyngeal exudate or posterior oropharyngeal erythema.  Eyes:     Extraocular Movements: Extraocular movements intact.  Cardiovascular:      Rate and Rhythm: Normal rate and regular rhythm.     Pulses: Normal pulses.     Heart sounds: Normal heart sounds. No murmur heard.  No friction rub. No gallop.   Pulmonary:     Effort: Pulmonary effort is normal. No respiratory distress.     Breath sounds: Normal breath sounds. No stridor. No wheezing, rhonchi or rales.  Abdominal:     General: Abdomen is flat.     Tenderness: There is no abdominal tenderness.  Musculoskeletal:        General: Swelling and tenderness present. Normal range of motion.     Cervical back: Normal range of motion and neck supple. No tenderness.     Right lower leg: No edema.     Left lower leg: No edema.     Comments: Moderate TTP, edema, erythema noted to the right medial forearm with mild edema spanning to the right wrist. FROM of the right wrist, elbow and shoulder, though difficult to assess ROM of the right wrist 2/2 edema. 2+ radial pulses noted bilaterally. Distal sensation intact. Please see images below.   Skin:    General: Skin is warm and dry.     Findings: Erythema present.  Neurological:     General: No focal deficit present.     Mental Status: He is alert and oriented to person, place, and time.  Psychiatric:        Mood and Affect: Mood normal.        Behavior: Behavior normal.    ED Results / Procedures / Treatments   Labs (all labs ordered are listed, but only abnormal results are displayed) Labs Reviewed  COMPREHENSIVE METABOLIC PANEL - Abnormal; Notable for the following components:      Result Value   BUN 27 (*)    Total Protein 8.3 (*)    All other components within normal limits  CBC WITH DIFFERENTIAL/PLATELET - Abnormal; Notable for the following components:   WBC 11.1 (*)    RBC 4.08 (*)    Hemoglobin 11.5 (*)    HCT 36.9 (*)    RDW 16.1 (*)    Monocytes Absolute 1.2 (*)    All other components within normal limits  CULTURE, BLOOD (ROUTINE X 2)  CULTURE, BLOOD (ROUTINE X 2)  SARS CORONAVIRUS 2 BY RT PCR (HOSPITAL  ORDER, PERFORMED IN [place] [facility])  LACTIC ACID, PLASMA  LACTIC ACID, PLASMA   EKG None  Radiology CT FOREARM RIGHT W CONTRAST  Result Date: 03/29/2020 CLINICAL DATA:  Right forearm pain, swelling and redness for 3 days and an IV drug abuser. EXAM: CT OF THE UPPER  RIGHT EXTREMITY WITH CONTRAST TECHNIQUE: Multidetector CT imaging of the upper right extremity was performed according to the standard protocol following intravenous contrast administration. COMPARISON:  None. CONTRAST:  100 mL OMNIPAQUE IOHEXOL 300 MG/ML  SOLN FINDINGS: Bones/Joint/Cartilage Appear normal throughout. No bony destructive change, periosteal reaction, fracture or focal lesion. Ligaments Suboptimally assessed by CT. Muscles and Tendons No intramuscular fluid collection is identified. No gas within muscle or tracking along fascial planes. No evidence of strain or tear. Soft tissues Stranding in subcutaneous fatty tissues is compatible with cellulitis. No focal fluid collection is identified. Major vascular structures opacify normally. IMPRESSION: Findings compatible with cellulitis. Negative for abscess or osteomyelitis. Electronically Signed   By: Drusilla Kanner M.D.   On: 03/29/2020 10:38   Procedures Procedures (including critical care time)  Medications Ordered in ED Medications  vancomycin (VANCOREADY) IVPB 1500 mg/300 mL (1,500 mg Intravenous New Bag/Given 03/29/20 0848)  HYDROcodone-acetaminophen (NORCO/VICODIN) 5-325 MG per tablet 1 tablet (1 tablet Oral Given 03/29/20 0848)  iohexol (OMNIPAQUE) 300 MG/ML solution 100 mL (100 mLs Intravenous Contrast Given 03/29/20 0093)   ED Course  I have reviewed the triage vital signs and the nursing notes.  Pertinent labs & imaging results that were available during my care of the patient were reviewed by me and considered in my medical decision making (see chart for details).  Clinical Course as of Mar 29 1112  Wed Mar 29, 2020  0716 Afebrile   Temp: 98.3 F  (36.8 C) [LJ]  0832 WBC(!): 11.1 [LJ]  0832 Pt discussed with attending physician who also evaluated the patient. Will order IV vancomycin per pharmacy. Will obtain a CT scan of the right forearm.   [LJ]  [id number] Lactic Acid, Venous: 0.6 [LJ]  1058 Findings compatible with cellulitis. Negative for abscess or osteomyelitis.    CT FOREARM RIGHT W CONTRAST [LJ]    Clinical Course User Index [LJ] Placido Sou, PA-C   MDM Rules/Calculators/A&P                          Pt is a 44 y.o. male that presents with a history, physical exam, and ED Clinical Course as noted above.   Patient presents today with what appears to be worsening cellulitis along the right forearm.  He was started on doxycycline which he has been taking for the last 2-1/2 days.  Symptoms have been progressively worsening for the past 5 days but he notes significant swelling and erythema in the last 24 hours.  I obtained a CT scan of the right forearm which is consistent with cellulitis without signs of abscess.  Patient has a history of IVDA and states that he injected heroin in the region prior to the onset of his symptoms.  Patient was started on IV vancomycin in the emergency department.  Due to his drug abuse, treatment failure, worsening symptoms, discussed with hospitalist for admission.  COVID-19 test ordered.  Note: Portions of this report may have been transcribed using voice recognition software. Every effort was made to ensure accuracy; however, inadvertent computerized transcription errors may be present.    Final Clinical Impression(s) / ED Diagnoses Final diagnoses:  Cellulitis of right upper extremity  Failure of outpatient treatment  IVDU (intravenous drug user)   Rx / DC Orders ED Discharge Orders    None       Placido Sou, PA-C 03/29/20 1116    Virgina Norfolk, DO 03/29/20 1119

## 2020-03-29 NOTE — H&P (Signed)
History and Physical    Kevin Beltonicholas Gilham ZOX:[id number] DOB: [birth year] DOA: 03/29/2020  PCP: Patient, No Pcp Per  Patient coming from: Home  Chief Complaint: R arm pain  HPI: Kevin Parker is a 44 y.o. male with medical history significant of ongoing IV drug abuse with history of recurrent arm cellulitis who presents to emergency department with right forearm cellulitis that failed outpatient doxycycline several days prior to this hospital visit.  ED Course: Patient noted to have a white blood count of 11.1.  Right forearm noted to be swollen and tender.  Patient underwent CT of right forearm, no drainable abscess noted.  Findings consistent with cellulitis.  Blood cultures were obtained emergency department.  Patient was started on vancomycin.  Hospitalist service consulted for consideration for medical admission  Review of Systems:  Review of Systems  Constitutional: Negative for malaise/fatigue.  HENT: Negative for ear pain, nosebleeds, sinus pain and tinnitus.   Eyes: Negative for double vision, photophobia and pain.  Respiratory: Negative for hemoptysis, sputum production and shortness of breath.   Cardiovascular: Negative for palpitations, orthopnea and leg swelling.  Gastrointestinal: Negative for abdominal pain, nausea and vomiting.  Genitourinary: Negative for frequency.  Musculoskeletal: Negative for back pain, falls and joint pain.  Skin:       Swelling tenderness over right forearm  Neurological: Negative for speech change, focal weakness, loss of consciousness and weakness.  Psychiatric/Behavioral: Positive for substance abuse. Negative for hallucinations and memory loss. The patient does not have insomnia.     Past Medical History:  Diagnosis Date  . IV drug user     Past Surgical History:  Procedure Laterality Date  . APPENDECTOMY       reports that he has been smoking cigarettes. He has been smoking about 1.00 pack per day. He has never used smokeless tobacco.  He reports previous alcohol use. He reports current drug use. Drugs: Marijuana and IV.  No Known Allergies  Family History  Problem Relation Age of Onset  . Healthy Mother   . Healthy Father     Prior to Admission medications   Medication Sig Start Date End Date Taking? Authorizing Provider  acetaminophen (TYLENOL) 500 MG tablet Take 1,000 mg by mouth every 6 (six) hours as needed for moderate pain.    [provider]  doxycycline (VIBRA-TABS) 100 MG tablet Take 1 tablet (100 mg total) by mouth 2 (two) times daily. Patient not taking: Reported on 03/27/2020 01/03/20   Linwood DibblesKnapp, Jon, MD  ibuprofen (ADVIL) 600 MG tablet Take 1 tablet (600 mg total) by mouth every 6 (six) hours as needed. Patient not taking: Reported on 03/27/2020 05/26/19   Liberty HandyGibbons, Claudia J, PA-C  naproxen (NAPROSYN) 500 MG tablet Take 1 tablet (500 mg total) by mouth 2 (two) times daily with a meal. As needed for pain Patient not taking: Reported on 03/27/2020 01/03/20   Linwood DibblesKnapp, Jon, MD  oxyCODONE (ROXICODONE) 5 MG immediate release tablet Take 1 tablet (5 mg total) by mouth every 6 (six) hours as needed for severe pain. Patient not taking: Reported on 03/27/2020 06/01/19   Melene PlanKim, Rachel E, MD    Physical Exam: Vitals:   03/29/20 0800 03/29/20 0815 03/29/20 0927 03/29/20 1026  BP: 128/89 135/87 126/79 134/81  Pulse:   80 79  Resp:   17 18  Temp:      TempSrc:      SpO2:   99% 100%  Weight:      Height:  Constitutional: NAD, calm, comfortable Vitals:   03/29/20 0800 03/29/20 0815 03/29/20 0927 03/29/20 1026  BP: 128/89 135/87 126/79 134/81  Pulse:   80 79  Resp:   17 18  Temp:      TempSrc:      SpO2:   99% 100%  Weight:      Height:       Eyes: PERRL, lids and conjunctivae normal ENMT: Mucous membranes are moist. Posterior pharynx clear of exudate Neck: normal, supple, no masses, no thyromegaly Respiratory: clear to auscultation bilaterally, no wheezing, no crackles. Normal respiratory effort.  No accessory muscle use.  Cardiovascular: Regular rate and rhythm, S1, S2 Abdomen: no tenderness, no masses palpated. No hepatosplenomegaly. Bowel sounds positive.  Musculoskeletal: no clubbing / cyanosis. No joint deformity upper and lower extremities. Good ROM, no contractures. Normal muscle tone.  Skin: Right forearm swollen with diffuse erythema, nondraining Neurologic: CN 2-12 grossly intact. Sensation intact,l. Strength 5/5 in all 4. Psychiatric: Normal judgment and insight. Alert and oriented x 3. Normal mood.    Labs on Admission: I have personally reviewed following labs and imaging studies  CBC: Recent Labs  Lab 03/27/20 0358 03/28/20 2213  WBC 9.9 11.1*  NEUTROABS 6.2 7.1  HGB 11.7* 11.5*  HCT 37.8* 36.9*  MCV 88.9 90.4  PLT 323 313   Basic Metabolic Panel: Recent Labs  Lab 03/27/20 0358 03/28/20 2213  NA 138 139  K 3.8 4.4  CL 101 101  CO2 25 28  GLUCOSE 97 95  BUN 28* 27*  CREATININE 1.21 0.93  CALCIUM 9.6 9.3  MG 2.3  --    GFR: Estimated Creatinine Clearance: 110.2 mL/min (by C-G formula based on SCr of 0.93 mg/dL). Liver Function Tests: Recent Labs  Lab 03/27/20 0358 03/28/20 2213  AST 25 23  ALT 21 21  ALKPHOS 82 77  BILITOT 0.4 0.6  PROT 8.3* 8.3*  ALBUMIN 3.7 4.2   No results for input(s): LIPASE, AMYLASE in the last 168 hours. No results for input(s): AMMONIA in the last 168 hours. Coagulation Profile: No results for input(s): INR, PROTIME in the last 168 hours. Cardiac Enzymes: No results for input(s): CKTOTAL, CKMB, CKMBINDEX, TROPONINI in the last 168 hours. BNP (last 3 results) No results for input(s): PROBNP in the last 8760 hours. HbA1C: No results for input(s): HGBA1C in the last 72 hours. CBG: No results for input(s): GLUCAP in the last 168 hours. Lipid Profile: Recent Labs    03/27/20 0358  CHOL 165  HDL 35*  LDLCALC 116*  TRIG 71  CHOLHDL 4.7   Thyroid Function Tests: Recent Labs    03/27/20 0358  TSH 1.305    Anemia Panel: No results for input(s): VITAMINB12, FOLATE, FERRITIN, TIBC, IRON, RETICCTPCT in the last 72 hours. Urine analysis: No results found for: COLORURINE, APPEARANCEUR, LABSPEC, PHURINE, GLUCOSEU, HGBUR, BILIRUBINUR, KETONESUR, PROTEINUR, UROBILINOGEN, NITRITE, LEUKOCYTESUR Sepsis Labs: !!!!!!!!!!!!!!!!!!!!!!!!!!!!!!!!!!!!!!!!!!!! @LABRCNTIP (procalcitonin:4,lacticidven:4) ) Recent Results (from the past 240 hour(s))  SARS Coronavirus 2 by RT PCR (hospital order, performed in [facility] hospital lab) Nasopharyngeal Nasopharyngeal Swab     Status: None   Collection Time: 03/27/20  3:40 AM   Specimen: Nasopharyngeal Swab  Result Value Ref Range Status   SARS Coronavirus 2 NEGATIVE NEGATIVE Final    Comment: (NOTE) SARS-CoV-2 target nucleic acids are NOT DETECTED.  The SARS-CoV-2 RNA is generally detectable in upper and lower respiratory specimens during the acute phase of infection. The lowest concentration of SARS-CoV-2 viral copies this assay can detect is 250  copies / mL. A negative result does not preclude SARS-CoV-2 infection and should not be used as the sole basis for treatment or other patient management decisions.  A negative result may occur with improper specimen collection / handling, submission of specimen other than nasopharyngeal swab, presence of viral mutation(s) within the areas targeted by this assay, and inadequate number of viral copies (<250 copies / mL). A negative result must be combined with clinical observations, patient history, and epidemiological information.  Fact Sheet for Patients:   BoilerBrush.com.cy  Fact Sheet for Healthcare Providers: https://pope.com/  This test is not yet approved or  cleared by the [place] FDA and has been authorized for detection and/or diagnosis of SARS-CoV-2 by FDA under an Emergency Use Authorization (EUA).  This EUA will remain in effect (meaning this test  can be used) for the duration of the COVID-19 declaration under Section 564(b)(1) of the Act, 21 U.S.C. section 360bbb-3(b)(1), unless the authorization is terminated or revoked sooner.  Performed at [facility], [address] [place], [place] [address]      Radiological Exams on Admission: CT FOREARM RIGHT W CONTRAST  Result Date: 03/29/2020 CLINICAL DATA:  Right forearm pain, swelling and redness for 3 days and an IV drug abuser. EXAM: CT OF THE UPPER RIGHT EXTREMITY WITH CONTRAST TECHNIQUE: Multidetector CT imaging of the upper right extremity was performed according to the standard protocol following intravenous contrast administration. COMPARISON:  None. CONTRAST:  100 mL OMNIPAQUE IOHEXOL 300 MG/ML  SOLN FINDINGS: Bones/Joint/Cartilage Appear normal throughout. No bony destructive change, periosteal reaction, fracture or focal lesion. Ligaments Suboptimally assessed by CT. Muscles and Tendons No intramuscular fluid collection is identified. No gas within muscle or tracking along fascial planes. No evidence of strain or tear. Soft tissues Stranding in subcutaneous fatty tissues is compatible with cellulitis. No focal fluid collection is identified. Major vascular structures opacify normally. IMPRESSION: Findings compatible with cellulitis. Negative for abscess or osteomyelitis. Electronically Signed   By: Drusilla Kanner M.D.   On: 03/29/2020 10:38    EKG: Independently reviewed.  Most recent EKG from 08/03/2019 reviewed, normal sinus rhythm with QTC of 444  Assessment/Plan Principal Problem:   Cellulitis of right forearm Active Problems:   Substance addiction (HCC)   Heroin use   Polysubstance abuse (HCC)   Hepatitis C virus infection   Methamphetamine abuse (HCC)   1. Cellulitis of right forearm, failure of outpatient antibiotics 1. Outpatient records reviewed.  Patient with recurrent episodes of right forearm cellulitis, required drainage previously 2. Currently  afebrile with mild leukocytosis in excess of 11,000 3. Patient failed a course of doxycycline prior to this admission 4. CT of forearm reviewed, no drainable abscess.  Findings consistent with cellulitis 5. Given ongoing IV drug abuse, high suspicion for underlying endocarditis 6. We will check 2D echocardiogram 7. Continue patient on vancomycin  8. Follow blood cultures 9. Repeat CBC 2. History of polysubstance abuse 1. Cessation done at bedside 2. Urine drug screen positive for morphine, methamphetamine, marijuana 3. History of hepatitis C 1. LFTs reviewed, appear to be stable 2. Repeat LFTs in the morning  DVT prophylaxis: Lovenox subq  Code Status: Full Family Communication: Pt in room  Disposition Plan: Uncertain at this time  Consults called:  Admission status: Inpatient as patient will require greater than 2 midnight stay for IV antibiotics given failure of oral antibiotics for recurrent arm cellulitis  Rickey Barbara MD [facility] Pager On Amion  If 7PM-7AM, please contact night-coverage  03/29/2020, 11:11 AM

## 2020-03-29 NOTE — ED Provider Notes (Signed)
Medical screening examination/treatment/procedure(s) were conducted as a shared visit with non-physician practitioner(s) and myself.  I personally evaluated the patient during the encounter. Briefly, the patient is a 44 y.o. male with history of IV drug abuse who presents to the ED with right arm pain, swelling.  Patient has been on antibiotics for right arm cellulitis for the last several days but swelling and redness gotten worse.  No fever.  Mild leukocytosis.  Normal lactic acid.  Patient appears to have worsening cellulitis of the right forearm.  Does not appear to involve any joint spaces.  There is some edema.  He has good pulses but pain when he flexes at his elbow and wrist likely from forearm inflammation.  We will get a CT scan to evaluate for any type of abscess.  Will start IV antibiotics and anticipate admission.  Vitals and lab work overall not consistent with sepsis but will get blood cultures.  This chart was dictated using voice recognition software.  Despite best efforts to proofread,  errors can occur which can change the documentation meaning.     EKG Interpretation None          Virgina Norfolk, DO 03/29/20 [phone]

## 2020-03-29 NOTE — Progress Notes (Signed)
A consult was received from an ED physician for vancomycin per pharmacy dosing.  The patient's profile has been reviewed for ht/wt/allergies/indication/available labs.    A one time order has been placed for vancomycin 1500 mg IV x1.  Further antibiotics/pharmacy consults should be ordered by admitting physician if indicated.                       Thank you, Lucia Gaskins 03/29/2020  8:32 AM

## 2020-03-30 LAB — CBC
HCT: 37.7 % — ABNORMAL LOW (ref 39.0–52.0)
Hemoglobin: 11.9 g/dL — ABNORMAL LOW (ref 13.0–17.0)
MCH: 28.1 pg (ref 26.0–34.0)
MCHC: 31.6 g/dL (ref 30.0–36.0)
MCV: 89.1 fL (ref 80.0–100.0)
Platelets: 320 10*3/uL (ref 150–400)
RBC: 4.23 MIL/uL (ref 4.22–5.81)
RDW: 15.5 % (ref 11.5–15.5)
WBC: 9.8 10*3/uL (ref 4.0–10.5)
nRBC: 0 % (ref 0.0–0.2)

## 2020-03-30 LAB — COMPREHENSIVE METABOLIC PANEL
ALT: 15 U/L (ref 0–44)
AST: 19 U/L (ref 15–41)
Albumin: 3.6 g/dL (ref 3.5–5.0)
Alkaline Phosphatase: 70 U/L (ref 38–126)
Anion gap: 12 (ref 5–15)
BUN: 14 mg/dL (ref 6–20)
CO2: 22 mmol/L (ref 22–32)
Calcium: 9.1 mg/dL (ref 8.9–10.3)
Chloride: 105 mmol/L (ref 98–111)
Creatinine, Ser: 0.8 mg/dL (ref 0.61–1.24)
GFR calc Af Amer: >60 mL/min (ref >60)
GFR calc non Af Amer: >60 mL/min (ref >60)
Glucose, Bld: 110 mg/dL — ABNORMAL HIGH (ref 70–99)
Potassium: 3.9 mmol/L (ref 3.5–5.1)
Sodium: 139 mmol/L (ref 135–145)
Total Bilirubin: 0.6 mg/dL (ref 0.3–1.2)
Total Protein: 8 g/dL (ref 6.5–8.1)

## 2020-03-30 LAB — TROPONIN I (HIGH SENSITIVITY)
Troponin I (High Sensitivity): <2 ng/L (ref <18)
Troponin I (High Sensitivity): 2 ng/L (ref <18)

## 2020-03-30 LAB — HIV ANTIBODY (ROUTINE TESTING W REFLEX): HIV Screen 4th Generation wRfx: NONREACTIVE

## 2020-03-30 MED ORDER — CLONIDINE HCL 0.1 MG PO TABS: 0.1000 mg | ORAL_TABLET | ORAL | Status: DC

## 2020-03-30 MED ORDER — CLONIDINE HCL 0.1 MG PO TABS: 0.1000 mg | ORAL_TABLET | Freq: Every day | ORAL | Status: DC

## 2020-03-30 MED ORDER — BUPRENORPHINE HCL-NALOXONE HCL 8-2 MG SL SUBL: 1.0000 | SUBLINGUAL_TABLET | Freq: Two times a day (BID) | SUBLINGUAL | Status: DC

## 2020-03-30 MED ORDER — HYDROCODONE-ACETAMINOPHEN 5-325 MG PO TABS
1.0000 | ORAL_TABLET | Freq: Three times a day (TID) | ORAL | Status: DC | PRN
  Administered 2020-03-30 – 2020-03-31 (×3): 1 via ORAL
  Filled 2020-03-30 (×3): qty 1

## 2020-03-30 MED ORDER — NAPROXEN 250 MG PO TABS
500.0000 mg | ORAL_TABLET | Freq: Two times a day (BID) | ORAL | Status: DC | PRN
  Filled 2020-03-30: qty 2

## 2020-03-30 MED ORDER — CLONIDINE HCL 0.1 MG PO TABS
0.1000 mg | ORAL_TABLET | Freq: Four times a day (QID) | ORAL | Status: DC
  Administered 2020-03-30 (×2): 0.1 mg via ORAL
  Filled 2020-03-30 (×2): qty 1

## 2020-03-30 MED ORDER — BUPRENORPHINE HCL-NALOXONE HCL 2-0.5 MG SL SUBL: 1.0000 | SUBLINGUAL_TABLET | SUBLINGUAL | Status: DC | PRN

## 2020-03-30 MED ORDER — ONDANSETRON 4 MG PO TBDP: 4.0000 mg | ORAL_TABLET | Freq: Four times a day (QID) | ORAL | Status: DC | PRN

## 2020-03-30 MED ORDER — DICYCLOMINE HCL 20 MG PO TABS: 20.0000 mg | ORAL_TABLET | Freq: Four times a day (QID) | ORAL | Status: DC | PRN

## 2020-03-30 MED ORDER — ONDANSETRON HCL 4 MG/2ML IJ SOLN
4.0000 mg | Freq: Four times a day (QID) | INTRAMUSCULAR | Status: DC | PRN
  Administered 2020-03-30: 4 mg via INTRAVENOUS
  Filled 2020-03-30: qty 2

## 2020-03-30 MED ORDER — LOPERAMIDE HCL 2 MG PO CAPS: 2.0000 mg | ORAL_CAPSULE | ORAL | Status: DC | PRN

## 2020-03-30 MED ORDER — HYDROXYZINE HCL 25 MG PO TABS
25.0000 mg | ORAL_TABLET | Freq: Four times a day (QID) | ORAL | Status: DC | PRN
  Administered 2020-03-30: 25 mg via ORAL
  Filled 2020-03-30: qty 1

## 2020-03-30 MED ORDER — METHOCARBAMOL 500 MG PO TABS
500.0000 mg | ORAL_TABLET | Freq: Three times a day (TID) | ORAL | Status: DC | PRN
  Administered 2020-03-30: 500 mg via ORAL
  Filled 2020-03-30: qty 1

## 2020-03-30 NOTE — Progress Notes (Signed)
Notified MD of pt possible starting to detox.  He is restless, a little irritable, and says feels like his skin is crawling. Orders given.

## 2020-03-30 NOTE — Progress Notes (Signed)
Went through patient's belongings and only found a syringe, which was disposed of in sharps container, and a bottle of doxycycline which will be sent to pharmacy.

## 2020-03-30 NOTE — Progress Notes (Signed)
PROGRESS NOTE    Kevin Parker  PXT:[id number] DOB: September 08, 1975 DOA: 03/29/2020 PCP: Patient, No Pcp Per    Brief Narrative: 44 year old male with IV drug abuse history, history of recurrent arm cellulitis who has failed outpatient doxycycline several days prior to hospital visit seen in the ED with ongoing arm cellulitis.  In the ED CT of the right arm showed no drainable abscess, findings consistent with cellulitis, blood culture was sent, echo was ordered and patient was admitted for further IV antibiotics  Subjective:  Reports right arm swelling and pain is improving.  He is asking for Vicodin for pain control, reports tramadol does not help. Afebrile overnight T-max 99.1. WBC 11.1, BMP stable electrolytes stable  Assessment & Plan:  Cellulitis of right forearm in the setting of IV drug abuse.  Failed outpatient doxycycline.  On vancomycin and cefepime, continue the same.  Swelling seems to be improving.  No drainable abscess on the CT.  Keep the arm elevated.  Add pain control with Vicodin.   IV drug abuse, his blood cultures are pending, 2D echocardiogram done on admission shows no acute significant valvular abnormalities.  Substance abuse cessation has been discussed on admission.  Urine drug screen positive for morphine methamphetamine and marijuana  History hepatitis C is relatively stable,   DVT prophylaxis: enoxaparin (LOVENOX) injection 40 mg Start: 03/29/20 1800 Code Status:   Code Status: Full Code  Family Communication: plan of care discussed with patient at bedside.  Status is: Inpatient  Remains inpatient appropriate because:IV treatments appropriate due to intensity of illness or inability to take PO and Inpatient level of care appropriate due to severity of illness   Dispo: The patient is from: Home              Anticipated d/c is to: Home              Anticipated d/c date is: 2 days              Patient currently is not medically stable to  d/c. Nutrition: Diet Order            Diet regular Room service appropriate? Yes; Fluid consistency: Thin  Diet effective now                 Body mass index is 23.71 kg/m. Consultants:see note  Procedures:see note Microbiology:see note Blood Culture    Component Value Date/Time   SDES  03/29/2020 1022    BLOOD LEFT HAND Performed at [facility], [address], [place], [place] [address]    SPECREQUEST  03/29/2020 1022    BOTTLES DRAWN AEROBIC AND ANAEROBIC Blood Culture results may not be optimal due to an inadequate volume of blood received in culture bottles Performed at [facility], [address] [place], [place] [address]    CULT  03/29/2020 1022    NO GROWTH < 24 HOURS Performed at [facility], [address], [place], [place] [address]    REPTSTATUS PENDING 03/29/2020 1022    Other culture-see note  Medications: Scheduled Meds: . enoxaparin (LOVENOX) injection  40 mg Subcutaneous Q24H   Continuous Infusions: . ceFEPime (MAXIPIME) IV 2 g (03/30/20 0518)  . vancomycin 1,000 mg (03/30/20 1016)    Antimicrobials: Anti-infectives (From admission, onward)   Start     Dose/Rate Route Frequency Ordered Stop   03/29/20 1800  vancomycin (VANCOCIN) IVPB 1000 mg/200 mL premix        1,000 mg 200  mL/hr over 60 Minutes Intravenous Every 8 hours 03/29/20 1335     03/29/20 1400  ceFEPIme (MAXIPIME) 2 g in sodium chloride 0.9 % 100 mL IVPB        2 g 200 mL/hr over 30 Minutes Intravenous Every 8 hours 03/29/20 1340     03/29/20 1215  vancomycin (VANCOCIN) IVPB 1000 mg/200 mL premix  Status:  Discontinued        1,000 mg 200 mL/hr over 60 Minutes Intravenous  Once 03/29/20 1213 03/29/20 1218   03/29/20 0845  vancomycin (VANCOREADY) IVPB 1500 mg/300 mL        1,500 mg 150 mL/hr over 120 Minutes Intravenous  Once 03/29/20 0833 03/29/20 1304       Objective: Vitals: Today's Vitals   03/29/20 1438 03/29/20 2100  03/29/20 2111 03/30/20 0520  BP: 130/83  132/83 (!) 149/91  Pulse: 73  83 77  Resp: Temp: 98.2 F (36.8 C)  99.1 F (37.3 C) 98.6 F (37 C)  TempSrc: Oral  Oral Oral  SpO2: 100%  100% 100%  Weight:      Height:      PainSc: 8  0-No pain      Intake/Output Summary (Last 24 hours) at 03/30/2020 1027 Last data filed at 03/30/2020 0600 Gross per 24 hour  Intake 1000.94 ml  Output --  Net 1000.94 ml   Filed Weights   03/28/20 2149  Weight: 77.1 kg   Weight change:    Intake/Output from previous day: 08/25 0701 - 08/26 0700 In: 1000.9 [IV Piggyback:1000.9] Out: -  Intake/Output this shift: No intake/output data recorded.  Examination:  General exam: AAOx ,NAD, weak appearing. HEENT:Oral mucosa moist, Ear/Nose WNL grossly,dentition normal. Respiratory system: bilaterally clear,no wheezing or crackles,no use of accessory muscle, non tender. Cardiovascular system: S1 & S2 +, regular, No JVD. Gastrointestinal system: Abdomen soft, NT,ND, BS+. Nervous System:Alert, awake, moving extremities and grossly nonfocal Extremities: No edema, distal peripheral pulses palpable.  Right arm area of cellulitis present-no drainage Skin: No rashes,no icterus. MSK: Normal muscle bulk,tone, power  Data Reviewed: I have personally reviewed following labs and imaging studies CBC: Recent Labs  Lab 03/27/20 0358 03/28/20 2213 03/30/20 0742  WBC 9.9 11.1* 9.8  NEUTROABS 6.2 7.1  --   HGB 11.7* 11.5* 11.9*  HCT 37.8* 36.9* 37.7*  MCV 88.9 90.4 89.1  PLT 323 313 320   Basic Metabolic Panel: Recent Labs  Lab 03/27/20 0358 03/28/20 2213 03/30/20 0742  NA 138 139 139  K 3.8 4.4 3.9  CL 101 101 105  CO2 GLUCOSE 97 95 110*  BUN 28* 27* 14  CREATININE 1.21 0.93 0.80  CALCIUM 9.6 9.3 9.1  MG 2.3  --   --    GFR: Estimated Creatinine Clearance: 128.1 mL/min (by C-G formula based on SCr of 0.8 mg/dL). Liver Function Tests: Recent Labs  Lab 03/27/20 0358  03/28/20 2213 03/30/20 0742  AST ALT ALKPHOS 82 77 70  BILITOT 0.4 0.6 0.6  PROT 8.3* 8.3* 8.0  ALBUMIN 3.7 4.2 3.6   No results for input(s): LIPASE, AMYLASE in the last 168 hours. No results for input(s): AMMONIA in the last 168 hours. Coagulation Profile: No results for input(s): INR, PROTIME in the last 168 hours. Cardiac Enzymes: No results for input(s): CKTOTAL, CKMB, CKMBINDEX, TROPONINI in the last 168 hours. BNP (last 3 results) No results for input(s): PROBNP in the last  8760 hours. HbA1C: No results for input(s): HGBA1C in the last 72 hours. CBG: No results for input(s): GLUCAP in the last 168 hours. Lipid Profile: No results for input(s): CHOL, HDL, LDLCALC, TRIG, CHOLHDL, LDLDIRECT in the last 72 hours. Thyroid Function Tests: No results for input(s): TSH, T4TOTAL, FREET4, T3FREE, THYROIDAB in the last 72 hours. Anemia Panel: No results for input(s): VITAMINB12, FOLATE, FERRITIN, TIBC, IRON, RETICCTPCT in the last 72 hours. Sepsis Labs: Recent Labs  Lab 03/28/20 2213  LATICACIDVEN 0.6    Recent Results (from the past 240 hour(s))  SARS Coronavirus 2 by RT PCR (hospital order, performed in [facility] hospital lab) Nasopharyngeal Nasopharyngeal Swab     Status: None   Collection Time: 03/27/20  3:40 AM   Specimen: Nasopharyngeal Swab  Result Value Ref Range Status   SARS Coronavirus 2 NEGATIVE NEGATIVE Final    Comment: (NOTE) SARS-CoV-2 target nucleic acids are NOT DETECTED.  The SARS-CoV-2 RNA is generally detectable in upper and lower respiratory specimens during the acute phase of infection. The lowest concentration of SARS-CoV-2 viral copies this assay can detect is 250 copies / mL. A negative result does not preclude SARS-CoV-2 infection and should not be used as the sole basis for treatment or other patient management decisions.  A negative result may occur with improper specimen collection / handling, submission of specimen  other than nasopharyngeal swab, presence of viral mutation(s) within the areas targeted by this assay, and inadequate number of viral copies (<250 copies / mL). A negative result must be combined with clinical observations, patient history, and epidemiological information.  Fact Sheet for Patients:   BoilerBrush.com.cy  Fact Sheet for Healthcare Providers: https://pope.com/  This test is not yet approved or  cleared by the [place] FDA and has been authorized for detection and/or diagnosis of SARS-CoV-2 by FDA under an Emergency Use Authorization (EUA).  This EUA will remain in effect (meaning this test can be used) for the duration of the COVID-19 declaration under Section 564(b)(1) of the Act, 21 U.S.C. section 360bbb-3(b)(1), unless the authorization is terminated or revoked sooner.  Performed at [facility], [address], [place], [place] [address]   SARS Coronavirus 2 by RT PCR (hospital order, performed in [facility] hospital lab) Nasopharyngeal Nasopharyngeal Swab     Status: None   Collection Time: 03/29/20 10:15 AM   Specimen: Nasopharyngeal Swab  Result Value Ref Range Status   SARS Coronavirus 2 NEGATIVE NEGATIVE Final    Comment: (NOTE) SARS-CoV-2 target nucleic acids are NOT DETECTED.  The SARS-CoV-2 RNA is generally detectable in upper and lower respiratory specimens during the acute phase of infection. The lowest concentration of SARS-CoV-2 viral copies this assay can detect is 250 copies / mL. A negative result does not preclude SARS-CoV-2 infection and should not be used as the sole basis for treatment or other patient management decisions.  A negative result may occur with improper specimen collection / handling, submission of specimen other than nasopharyngeal swab, presence of viral mutation(s) within the areas targeted by this assay, and inadequate number of viral copies (<250 copies / mL). A  negative result must be combined with clinical observations, patient history, and epidemiological information.  Fact Sheet for Patients:   BoilerBrush.com.cy  Fact Sheet for Healthcare Providers: https://pope.com/  This test is not yet approved or  cleared by the [place] FDA and has been authorized for detection and/or diagnosis of SARS-CoV-2 by FDA under an Emergency Use Authorization (EUA).  This  EUA will remain in effect (meaning this test can be used) for the duration of the COVID-19 declaration under Section 564(b)(1) of the Act, 21 U.S.C. section 360bbb-3(b)(1), unless the authorization is terminated or revoked sooner.  Performed at [facility], [address] [place], [place] [address]   Blood culture (routine x 2)     Status: None (Preliminary result)   Collection Time: 03/29/20 10:22 AM   Specimen: BLOOD LEFT HAND  Result Value Ref Range Status   Specimen Description   Final    BLOOD LEFT HAND Performed at [facility], [address] [place], [place] [address]    Special Requests   Final    BOTTLES DRAWN AEROBIC AND ANAEROBIC Blood Culture results may not be optimal due to an inadequate volume of blood received in culture bottles Performed at [facility], [address], [place], [place] [address]    Culture   Final    NO GROWTH < 24 HOURS Performed at [facility], [address], [place], [place] [address]    Report Status PENDING  Incomplete      Radiology Studies: CT FOREARM RIGHT W CONTRAST  Result Date: 03/29/2020 CLINICAL DATA:  Right forearm pain, swelling and redness for 3 days and an IV drug abuser. EXAM: CT OF THE UPPER RIGHT EXTREMITY WITH CONTRAST TECHNIQUE: Multidetector CT imaging of the upper right extremity was performed according to the standard protocol following intravenous contrast administration. COMPARISON:  None. CONTRAST:   100 mL OMNIPAQUE IOHEXOL 300 MG/ML  SOLN FINDINGS: Bones/Joint/Cartilage Appear normal throughout. No bony destructive change, periosteal reaction, fracture or focal lesion. Ligaments Suboptimally assessed by CT. Muscles and Tendons No intramuscular fluid collection is identified. No gas within muscle or tracking along fascial planes. No evidence of strain or tear. Soft tissues Stranding in subcutaneous fatty tissues is compatible with cellulitis. No focal fluid collection is identified. Major vascular structures opacify normally. IMPRESSION: Findings compatible with cellulitis. Negative for abscess or osteomyelitis. Electronically Signed   By: Drusilla Kanner M.D.   On: 03/29/2020 10:38   ECHOCARDIOGRAM COMPLETE  Result Date: 03/29/2020    ECHOCARDIOGRAM REPORT   Patient Name:   Kevin Parker Date of Exam: 03/29/2020 Medical Rec #:  [id number]       Height:       71.0 in Accession #:    [id number]      Weight:       170.0 lb Date of Birth:  04-13-1976       BSA:          1.968 m Patient Age:    44 years        BP:           130/77 mmHg Patient Gender: M               HR:           70 bpm. Exam Location:  Inpatient Procedure: 2D Echo Indications:     I38 endocarditis  History:         Patient has no prior history of Echocardiogram examinations.                  Risk Factors:Current Smoker. IV drug use.  Sonographer:     Celene Skeen RDCS (AE) Referring Phys:  [id number] Scher[address] Marten CHIU Diagnosing Phys: Lennie Odor MD IMPRESSIONS  1. Left ventricular ejection fraction, by estimation, is 55 to 60%. The left ventricle has normal function.  The left ventricle has no regional wall motion abnormalities. Left ventricular diastolic parameters were normal.  2. Right ventricular systolic function is normal. The right ventricular size is normal. Tricuspid regurgitation signal is inadequate for assessing PA pressure.  3. The mitral valve is grossly normal. No evidence of mitral valve regurgitation. No evidence of mitral  stenosis.  4. The aortic valve is tricuspid. Aortic valve regurgitation is not visualized. No aortic stenosis is present.  5. The inferior vena cava is normal in size with greater than 50% respiratory variability, suggesting right atrial pressure of 3 mmHg. Conclusion(s)/Recommendation(s): Normal biventricular function without evidence of hemodynamically significant valvular heart disease. No evidence of valvular vegetations on this transthoracic echocardiogram. Would recommend a transesophageal echocardiogram to exclude infective endocarditis if clinically indicated. FINDINGS  Left Ventricle: Left ventricular ejection fraction, by estimation, is 55 to 60%. The left ventricle has normal function. The left ventricle has no regional wall motion abnormalities. The left ventricular internal cavity size was normal in size. There is  no left ventricular hypertrophy. Left ventricular diastolic parameters were normal. Right Ventricle: The right ventricular size is normal. No increase in right ventricular wall thickness. Right ventricular systolic function is normal. Tricuspid regurgitation signal is inadequate for assessing PA pressure. Left Atrium: Left atrial size was normal in size. Right Atrium: Right atrial size was normal in size. Pericardium: There is no evidence of pericardial effusion. Mitral Valve: The mitral valve is grossly normal. No evidence of mitral valve regurgitation. No evidence of mitral valve stenosis. Tricuspid Valve: The tricuspid valve is grossly normal. Tricuspid valve regurgitation is not demonstrated. No evidence of tricuspid stenosis. Aortic Valve: The aortic valve is tricuspid. Aortic valve regurgitation is not visualized. No aortic stenosis is present. Pulmonic Valve: The pulmonic valve was grossly normal. Pulmonic valve regurgitation is not visualized. No evidence of pulmonic stenosis. Aorta: The aortic root is normal in size and structure. Venous: The inferior vena cava is normal in size with  greater than 50% respiratory variability, suggesting right atrial pressure of 3 mmHg. IAS/Shunts: The atrial septum is grossly normal.  LEFT VENTRICLE PLAX 2D LVIDd:         4.90 cm  Diastology LVIDs:         3.40 cm  LV e' lateral:   18.90 cm/s LV PW:         0.80 cm  LV E/e' lateral: 4.2 LV IVS:        0.70 cm  LV e' medial:    13.75 cm/s LVOT diam:     2.20 cm  LV E/e' medial:  5.8 LV SV:         90 LV SV Index:   46 LVOT Area:     3.80 cm  RIGHT VENTRICLE RV S prime:     11.50 cm/s TAPSE (M-mode): 1.8 cm LEFT ATRIUM             Index       RIGHT ATRIUM           Index LA diam:        3.10 cm 1.58 cm/m  RA Area:     11.50 cm LA Vol (A2C):   26.7 ml 13.57 ml/m RA Volume:   22.10 ml  11.23 ml/m LA Vol (A4C):   27.4 ml 13.92 ml/m LA Biplane Vol: 27.7 ml 14.08 ml/m  AORTIC VALVE LVOT Vmax:   117.00 cm/s LVOT Vmean:  89.600 cm/s LVOT VTI:    0.238 m  AORTA Ao Root  diam: 2.30 cm MITRAL VALVE MV Area (PHT): 2.66 cm    SHUNTS MV Decel Time: 285 msec    Systemic VTI:  0.24 m MV E velocity: 79.30 cm/s  Systemic Diam: 2.20 cm MV A velocity: 52.30 cm/s MV E/A ratio:  1.52 Lennie Odor MD Electronically signed by Lennie Odor MD Signature Date/Time: 03/29/2020/3:40:30 PM    Final (Updated)      LOS: 1 day   Lanae Boast, MD [facility]  03/30/2020, 10:27 AM

## 2020-03-30 NOTE — Progress Notes (Signed)
Pt c/o chest pains, mostly hurt on exhalation. EKG performed, MD notified.

## 2020-03-31 LAB — BASIC METABOLIC PANEL
Anion gap: 11 (ref 5–15)
BUN: 13 mg/dL (ref 6–20)
CO2: 24 mmol/L (ref 22–32)
Calcium: 9.3 mg/dL (ref 8.9–10.3)
Chloride: 102 mmol/L (ref 98–111)
Creatinine, Ser: 0.8 mg/dL (ref 0.61–1.24)
GFR calc Af Amer: >60 mL/min (ref >60)
GFR calc non Af Amer: >60 mL/min (ref >60)
Glucose, Bld: 119 mg/dL — ABNORMAL HIGH (ref 70–99)
Potassium: 4 mmol/L (ref 3.5–5.1)
Sodium: 137 mmol/L (ref 135–145)

## 2020-03-31 LAB — CBC
HCT: 37.3 % — ABNORMAL LOW (ref 39.0–52.0)
Hemoglobin: 11.8 g/dL — ABNORMAL LOW (ref 13.0–17.0)
MCH: 27.8 pg (ref 26.0–34.0)
MCHC: 31.6 g/dL (ref 30.0–36.0)
MCV: 87.8 fL (ref 80.0–100.0)
Platelets: 360 10*3/uL (ref 150–400)
RBC: 4.25 MIL/uL (ref 4.22–5.81)
RDW: 15.1 % (ref 11.5–15.5)
WBC: 10 10*3/uL (ref 4.0–10.5)
nRBC: 0 % (ref 0.0–0.2)

## 2020-03-31 MED ORDER — BUPRENORPHINE HCL-NALOXONE HCL 8-2 MG SL SUBL: 1.0000 | SUBLINGUAL_TABLET | Freq: Every day | SUBLINGUAL | Status: DC

## 2020-03-31 MED ORDER — CLINDAMYCIN HCL 300 MG PO CAPS: 600.0000 mg | ORAL_CAPSULE | Freq: Three times a day (TID) | ORAL | 0 refills | Status: AC

## 2020-03-31 MED FILL — CLINDAMYCIN HCL 300 MG CAP: 300 | 7 days supply | Qty: 42 | Fill #0

## 2020-03-31 NOTE — Consult Note (Signed)
Pt alert and aware getting ready to leave the hospital. I told him there was a note in the chart about AD and what that meant. I asked him if he would like to take the form home and complete it. He said yes and asked for prayer. The chaplain offered caring and supportive presence prayers and blessings.

## 2020-03-31 NOTE — Progress Notes (Signed)
Pt alert and oriented, tolerating diet. D/C instructions given.

## 2020-03-31 NOTE — Progress Notes (Signed)
Pt IV occluded, IV team paged to place new IV. Pt refusing IV placement at this time. Pt educated on importance of IV antibiotics. Pt states maybe in a few hours he will let us place an IV.

## 2020-03-31 NOTE — Discharge Summary (Signed)
Physician Discharge Summary  Binyamin Nelis AOZ:[id number] DOB: 1976-01-31 DOA: 03/29/2020  PCP: Patient, No Pcp Per  Admit date: 03/29/2020 Discharge date: 03/31/2020  Admitted From: home Disposition:  Home  Recommendations for Outpatient Follow-up:  1. Follow up with PCP in 1-2 weeks 2. Please obtain BMP/CBC in one week 3. Please follow up on the following pending results:  Home Health:No  Equipment/Devices: None  Discharge Condition: Stable Code Status:   Code Status: Full Code Diet recommendation:  Diet Order            Diet general           Diet regular Room service appropriate? Yes; Fluid consistency: Thin  Diet effective now                  Brief/Interim Summary:  44 year old male with IV drug abuse history, history of recurrent arm cellulitis who has failed outpatient doxycycline several days prior to hospital visit seen in the ED with ongoing arm cellulitis.  In the ED CT of the right arm showed no drainable abscess, findings consistent with cellulitis, blood culture was sent, echo was ordered and patient was admitted for further IV antibiotics Patient was treated with vancomycin and cefepime.  His right arm swelling has significantly improved he has no pain able to move arm without any issues.  Is requesting to be discharged today.  His blood culture has been negative he has been afebrile, echocardiogram no significant finding. Patient appears stable at this time. He had withdrawal symptoms and was managed with clonidine and this morning alert awake x3 and he is he reports he is setting up appointment for outpatient substance abuse program  Discharge Diagnoses:  Principal Problem:   Cellulitis of right forearm: Improved nicely, will discharge him on clindamycin as he failed outpatient doxycycline therapy.  TOC consulted to help with medication assistance and medication sent over slow pharmacy Active Problems:    Heroin use/Polysubstance abuse/Substance addiction is  going to follow-up for outpatient substance abuse cessation program  Hepatitis C virus infection  Methamphetamine abuse   Consults:  TOC  Subjective: Resting comfortably alert awake oriented x3.  Requesting to be discharged home today.  Discharge Exam: Vitals:   03/31/20 0203 03/31/20 0647  BP: (!) 138/97 (!) 130/91  Pulse: 67 62  Resp: 16 16  Temp:  98.4 F (36.9 C)  SpO2: 100% 100%   General: Pt is alert, awake, not in acute distress Cardiovascular: RRR, S1/S2 +, no rubs, no gallops Respiratory: CTA bilaterally, no wheezing, no rhonchi Abdominal: Soft, NT, ND, bowel sounds + Extremities: no edema, no cyanosis  Discharge Instructions  Discharge Instructions    Diet general   Complete by: As directed    Discharge instructions   Complete by: As directed    Please call call MD or return to ER for similar or worsening recurring problem that brought you to hospital or if any fever,nausea/vomiting,abdominal pain, uncontrolled pain, chest pain,  shortness of breath or any other alarming symptoms.  Please follow-up your doctor as instructed in a week time and call the office for appointment.  Please avoid alcohol, smoking, or any other illicit substance and maintain healthy habits including taking your regular medications as prescribed.  You were cared for by a hospitalist during your hospital stay. If you have any questions about your discharge medications or the care you received while you were in the hospital after you are discharged, you can call the unit and ask to speak with the  hospitalist on call if the hospitalist that took care of you is not available.  Once you are discharged, your primary care physician will handle any further medical issues. Please note that NO REFILLS for any discharge medications will be authorized once you are discharged, as it is imperative that you return to your primary care physician (or establish a relationship with a primary care physician if  you do not have one) for your aftercare needs so that they can reassess your need for medications and monitor your lab values   Increase activity slowly   Complete by: As directed      Allergies as of 03/31/2020   No Known Allergies     Medication List    STOP taking these medications   doxycycline 100 MG tablet Commonly known as: VIBRA-TABS   naproxen 500 MG tablet Commonly known as: Naprosyn     TAKE these medications   acetaminophen 500 MG tablet Commonly known as: TYLENOL Take 1,000 mg by mouth every 6 (six) hours as needed for moderate pain.   clindamycin 300 MG capsule Commonly known as: Cleocin Take 2 capsules (600 mg total) by mouth 3 (three) times daily for 7 days.   ibuprofen 600 MG tablet Commonly known as: ADVIL Take 1 tablet (600 mg total) by mouth every 6 (six) hours as needed.   oxyCODONE 5 MG immediate release tablet Commonly known as: Roxicodone Take 1 tablet (5 mg total) by mouth every 6 (six) hours as needed for severe pain.       Follow-up Information    [name]. Call in 1 week(s).   Contact information: [address] [place] [address] [phone]             No Known Allergies  The results of significant diagnostics from this hospitalization (including imaging, microbiology, ancillary and laboratory) are listed below for reference.    Microbiology: Recent Results (from the past 240 hour(s))  SARS Coronavirus 2 by RT PCR (hospital order, performed in [facility] hospital lab) Nasopharyngeal Nasopharyngeal Swab     Status: None   Collection Time: 03/27/20  3:40 AM   Specimen: Nasopharyngeal Swab  Result Value Ref Range Status   SARS Coronavirus 2 NEGATIVE NEGATIVE Final    Comment: (NOTE) SARS-CoV-2 target nucleic acids are NOT DETECTED.  The SARS-CoV-2 RNA is generally detectable in upper and lower respiratory specimens during the acute phase of infection. The  lowest concentration of SARS-CoV-2 viral copies this assay can detect is 250 copies / mL. A negative result does not preclude SARS-CoV-2 infection and should not be used as the sole basis for treatment or other patient management decisions.  A negative result may occur with improper specimen collection / handling, submission of specimen other than nasopharyngeal swab, presence of viral mutation(s) within the areas targeted by this assay, and inadequate number of viral copies (<250 copies / mL). A negative result must be combined with clinical observations, patient history, and epidemiological information.  Fact Sheet for Patients:   BoilerBrush.com.cy  Fact Sheet for Healthcare Providers: https://pope.com/  This test is not yet approved or  cleared by the [place] FDA and has been authorized for detection and/or diagnosis of SARS-CoV-2 by FDA under an Emergency Use Authorization (EUA).  This EUA will remain in effect (meaning this test can be used) for the duration of the COVID-19 declaration under Section 564(b)(1) of the Act, 21 U.S.C. section 360bbb-3(b)(1), unless the authorization is terminated or revoked  sooner.  Performed at [facility], [address] [place], [place] [address]   SARS Coronavirus 2 by RT PCR (hospital order, performed in [facility] hospital lab) Nasopharyngeal Nasopharyngeal Swab     Status: None   Collection Time: 03/29/20 10:15 AM   Specimen: Nasopharyngeal Swab  Result Value Ref Range Status   SARS Coronavirus 2 NEGATIVE NEGATIVE Final    Comment: (NOTE) SARS-CoV-2 target nucleic acids are NOT DETECTED.  The SARS-CoV-2 RNA is generally detectable in upper and lower respiratory specimens during the acute phase of infection. The lowest concentration of SARS-CoV-2 viral copies this assay can detect is 250 copies / mL. A negative result does not preclude SARS-CoV-2 infection and should not be  used as the sole basis for treatment or other patient management decisions.  A negative result may occur with improper specimen collection / handling, submission of specimen other than nasopharyngeal swab, presence of viral mutation(s) within the areas targeted by this assay, and inadequate number of viral copies (<250 copies / mL). A negative result must be combined with clinical observations, patient history, and epidemiological information.  Fact Sheet for Patients:   BoilerBrush.com.cy  Fact Sheet for Healthcare Providers: https://pope.com/  This test is not yet approved or  cleared by the [place] FDA and has been authorized for detection and/or diagnosis of SARS-CoV-2 by FDA under an Emergency Use Authorization (EUA).  This EUA will remain in effect (meaning this test can be used) for the duration of the COVID-19 declaration under Section 564(b)(1) of the Act, 21 U.S.C. section 360bbb-3(b)(1), unless the authorization is terminated or revoked sooner.  Performed at [facility], [address] [place], [place] [address]   Blood culture (routine x 2)     Status: None (Preliminary result)   Collection Time: 03/29/20 10:22 AM   Specimen: BLOOD LEFT HAND  Result Value Ref Range Status   Specimen Description   Final    BLOOD LEFT HAND Performed at [facility], [address] [place], [place] [address]    Special Requests   Final    BOTTLES DRAWN AEROBIC AND ANAEROBIC Blood Culture results may not be optimal due to an inadequate volume of blood received in culture bottles Performed at [facility], [address], [place], [place] [address]    Culture   Final    NO GROWTH 2 DAYS Performed at [facility], [address], [place], [place] [address]    Report Status PENDING  Incomplete    Procedures/Studies: CT FOREARM RIGHT W CONTRAST  Result Date:  03/29/2020 CLINICAL DATA:  Right forearm pain, swelling and redness for 3 days and an IV drug abuser. EXAM: CT OF THE UPPER RIGHT EXTREMITY WITH CONTRAST TECHNIQUE: Multidetector CT imaging of the upper right extremity was performed according to the standard protocol following intravenous contrast administration. COMPARISON:  None. CONTRAST:  100 mL OMNIPAQUE IOHEXOL 300 MG/ML  SOLN FINDINGS: Bones/Joint/Cartilage Appear normal throughout. No bony destructive change, periosteal reaction, fracture or focal lesion. Ligaments Suboptimally assessed by CT. Muscles and Tendons No intramuscular fluid collection is identified. No gas within muscle or tracking along fascial planes. No evidence of strain or tear. Soft tissues Stranding in subcutaneous fatty tissues is compatible with cellulitis. No focal fluid collection is identified. Major vascular structures opacify normally. IMPRESSION: Findings compatible with cellulitis. Negative for abscess or osteomyelitis. Electronically Signed   By: Drusilla Kanner M.D.   On: 03/29/2020 10:38   ECHOCARDIOGRAM  COMPLETE  Result Date: 03/29/2020    ECHOCARDIOGRAM REPORT   Patient Name:   Kevin BeltonICHOLAS Gunawan Date of Exam: 03/29/2020 Medical Rec #:  [id number]       Height:       71.0 in Accession #:    [id number]      Weight:       170.0 lb Date of Birth:  1975/11/24       BSA:          1.968 m Patient Age:    44 years        BP:           130/77 mmHg Patient Gender: M               HR:           70 bpm. Exam Location:  Inpatient Procedure: 2D Echo Indications:     I38 endocarditis  History:         Patient has no prior history of Echocardiogram examinations.                  Risk Factors:Current Smoker. IV drug use.  Sonographer:     Celene SkeenVijay Shankar RDCS (AE) Referring Phys:  [id number] Scheryl MartenSTEPHEN K CHIU Diagnosing Phys: Lennie OdorWesley O'Neal MD IMPRESSIONS  1. Left ventricular ejection fraction, by estimation, is 55 to 60%. The left ventricle has normal function. The left ventricle has no regional wall  motion abnormalities. Left ventricular diastolic parameters were normal.  2. Right ventricular systolic function is normal. The right ventricular size is normal. Tricuspid regurgitation signal is inadequate for assessing PA pressure.  3. The mitral valve is grossly normal. No evidence of mitral valve regurgitation. No evidence of mitral stenosis.  4. The aortic valve is tricuspid. Aortic valve regurgitation is not visualized. No aortic stenosis is present.  5. The inferior vena cava is normal in size with greater than 50% respiratory variability, suggesting right atrial pressure of 3 mmHg. Conclusion(s)/Recommendation(s): Normal biventricular function without evidence of hemodynamically significant valvular heart disease. No evidence of valvular vegetations on this transthoracic echocardiogram. Would recommend a transesophageal echocardiogram to exclude infective endocarditis if clinically indicated. FINDINGS  Left Ventricle: Left ventricular ejection fraction, by estimation, is 55 to 60%. The left ventricle has normal function. The left ventricle has no regional wall motion abnormalities. The left ventricular internal cavity size was normal in size. There is  no left ventricular hypertrophy. Left ventricular diastolic parameters were normal. Right Ventricle: The right ventricular size is normal. No increase in right ventricular wall thickness. Right ventricular systolic function is normal. Tricuspid regurgitation signal is inadequate for assessing PA pressure. Left Atrium: Left atrial size was normal in size. Right Atrium: Right atrial size was normal in size. Pericardium: There is no evidence of pericardial effusion. Mitral Valve: The mitral valve is grossly normal. No evidence of mitral valve regurgitation. No evidence of mitral valve stenosis. Tricuspid Valve: The tricuspid valve is grossly normal. Tricuspid valve regurgitation is not demonstrated. No evidence of tricuspid stenosis. Aortic Valve: The aortic  valve is tricuspid. Aortic valve regurgitation is not visualized. No aortic stenosis is present. Pulmonic Valve: The pulmonic valve was grossly normal. Pulmonic valve regurgitation is not visualized. No evidence of pulmonic stenosis. Aorta: The aortic root is normal in size and structure. Venous: The inferior vena cava is normal in size with greater than 50% respiratory variability, suggesting right atrial pressure of 3 mmHg. IAS/Shunts: The atrial septum is grossly normal.  LEFT VENTRICLE PLAX 2D LVIDd:  4.90 cm  Diastology LVIDs:         3.40 cm  LV e' lateral:   18.90 cm/s LV PW:         0.80 cm  LV E/e' lateral: 4.2 LV IVS:        0.70 cm  LV e' medial:    13.75 cm/s LVOT diam:     2.20 cm  LV E/e' medial:  5.8 LV SV:         90 LV SV Index:   46 LVOT Area:     3.80 cm  RIGHT VENTRICLE RV S prime:     11.50 cm/s TAPSE (M-mode): 1.8 cm LEFT ATRIUM             Index       RIGHT ATRIUM           Index LA diam:        3.10 cm 1.58 cm/m  RA Area:     11.50 cm LA Vol (A2C):   26.7 ml 13.57 ml/m RA Volume:   22.10 ml  11.23 ml/m LA Vol (A4C):   27.4 ml 13.92 ml/m LA Biplane Vol: 27.7 ml 14.08 ml/m  AORTIC VALVE LVOT Vmax:   117.00 cm/s LVOT Vmean:  89.600 cm/s LVOT VTI:    0.238 m  AORTA Ao Root diam: 2.30 cm MITRAL VALVE MV Area (PHT): 2.66 cm    SHUNTS MV Decel Time: 285 msec    Systemic VTI:  0.24 m MV E velocity: 79.30 cm/s  Systemic Diam: 2.20 cm MV A velocity: 52.30 cm/s MV E/A ratio:  1.52 Lennie Odor MD Electronically signed by Lennie Odor MD Signature Date/Time: 03/29/2020/3:40:30 PM    Final (Updated)      Labs: BNP (last 3 results) No results for input(s): BNP in the last 8760 hours. Basic Metabolic Panel: Recent Labs  Lab 03/27/20 0358 03/28/20 2213 03/30/20 0742 03/31/20 0459  NA 138 139 139 137  K 3.8 4.4 3.9 4.0  CL 101 101 105 102  CO2 25 28 22 24   GLUCOSE 97 95 110* 119*  BUN 28* 27* 14 13  CREATININE 1.21 0.93 0.80 0.80  CALCIUM 9.6 9.3 9.1 9.3  MG 2.3  --   --    --    Liver Function Tests: Recent Labs  Lab 03/27/20 0358 03/28/20 2213 03/30/20 0742  AST 25 23 19   ALT 21 21 15   ALKPHOS 82 77 70  BILITOT 0.4 0.6 0.6  PROT 8.3* 8.3* 8.0  ALBUMIN 3.7 4.2 3.6   No results for input(s): LIPASE, AMYLASE in the last 168 hours. No results for input(s): AMMONIA in the last 168 hours. CBC: Recent Labs  Lab 03/27/20 0358 03/28/20 2213 03/30/20 0742 03/31/20 0459  WBC 9.9 11.1* 9.8 10.0  NEUTROABS 6.2 7.1  --   --   HGB 11.7* 11.5* 11.9* 11.8*  HCT 37.8* 36.9* 37.7* 37.3*  MCV 88.9 90.4 89.1 87.8  PLT 323 313 320 360   Cardiac Enzymes: No results for input(s): CKTOTAL, CKMB, CKMBINDEX, TROPONINI in the last 168 hours. BNP: Invalid input(s): POCBNP CBG: No results for input(s): GLUCAP in the last 168 hours. D-Dimer No results for input(s): DDIMER in the last 72 hours. Hgb A1c No results for input(s): HGBA1C in the last 72 hours. Lipid Profile No results for input(s): CHOL, HDL, LDLCALC, TRIG, CHOLHDL, LDLDIRECT in the last 72 hours. Thyroid function studies No results for input(s): TSH, T4TOTAL, T3FREE, THYROIDAB in the last 72 hours.  Invalid input(s): FREET3 Anemia work up No results for input(s): VITAMINB12, FOLATE, FERRITIN, TIBC, IRON, RETICCTPCT in the last 72 hours. Urinalysis No results found for: COLORURINE, APPEARANCEUR, LABSPEC, PHURINE, GLUCOSEU, HGBUR, BILIRUBINUR, KETONESUR, PROTEINUR, UROBILINOGEN, NITRITE, LEUKOCYTESUR Sepsis Labs Invalid input(s): PROCALCITONIN,  WBC,  LACTICIDVEN Microbiology Recent Results (from the past 240 hour(s))  SARS Coronavirus 2 by RT PCR (hospital order, performed in [facility] hospital lab) Nasopharyngeal Nasopharyngeal Swab     Status: None   Collection Time: 03/27/20  3:40 AM   Specimen: Nasopharyngeal Swab  Result Value Ref Range Status   SARS Coronavirus 2 NEGATIVE NEGATIVE Final    Comment: (NOTE) SARS-CoV-2 target nucleic acids are NOT DETECTED.  The SARS-CoV-2 RNA is  generally detectable in upper and lower respiratory specimens during the acute phase of infection. The lowest concentration of SARS-CoV-2 viral copies this assay can detect is 250 copies / mL. A negative result does not preclude SARS-CoV-2 infection and should not be used as the sole basis for treatment or other patient management decisions.  A negative result may occur with improper specimen collection / handling, submission of specimen other than nasopharyngeal swab, presence of viral mutation(s) within the areas targeted by this assay, and inadequate number of viral copies (<250 copies / mL). A negative result must be combined with clinical observations, patient history, and epidemiological information.  Fact Sheet for Patients:   BoilerBrush.com.cy  Fact Sheet for Healthcare Providers: https://pope.com/  This test is not yet approved or  cleared by the [place] FDA and has been authorized for detection and/or diagnosis of SARS-CoV-2 by FDA under an Emergency Use Authorization (EUA).  This EUA will remain in effect (meaning this test can be used) for the duration of the COVID-19 declaration under Section 564(b)(1) of the Act, 21 U.S.C. section 360bbb-3(b)(1), unless the authorization is terminated or revoked sooner.  Performed at [facility], [address], [place], [place] [address]   SARS Coronavirus 2 by RT PCR (hospital order, performed in [facility] hospital lab) Nasopharyngeal Nasopharyngeal Swab     Status: None   Collection Time: 03/29/20 10:15 AM   Specimen: Nasopharyngeal Swab  Result Value Ref Range Status   SARS Coronavirus 2 NEGATIVE NEGATIVE Final    Comment: (NOTE) SARS-CoV-2 target nucleic acids are NOT DETECTED.  The SARS-CoV-2 RNA is generally detectable in upper and lower respiratory specimens during the acute phase of infection. The lowest concentration of SARS-CoV-2 viral copies this assay can  detect is 250 copies / mL. A negative result does not preclude SARS-CoV-2 infection and should not be used as the sole basis for treatment or other patient management decisions.  A negative result may occur with improper specimen collection / handling, submission of specimen other than nasopharyngeal swab, presence of viral mutation(s) within the areas targeted by this assay, and inadequate number of viral copies (<250 copies / mL). A negative result must be combined with clinical observations, patient history, and epidemiological information.  Fact Sheet for Patients:   BoilerBrush.com.cy  Fact Sheet for Healthcare Providers: https://pope.com/  This test is not yet approved or  cleared by the [place] FDA and has been authorized for detection and/or diagnosis of SARS-CoV-2 by FDA under an Emergency Use Authorization (EUA).  This EUA will remain in effect (meaning this test can be used) for the duration of the COVID-19 declaration under Section 564(b)(1) of the Act, 21 U.S.C. section 360bbb-3(b)(1), unless the authorization is terminated or revoked sooner.  Performed at [facility], [address] [place], [place] [address]   Blood culture (routine x 2)     Status: None (Preliminary result)   Collection Time: 03/29/20 10:22 AM   Specimen: BLOOD LEFT HAND  Result Value Ref Range Status   Specimen Description   Final    BLOOD LEFT HAND Performed at [facility], [address] [place], [place] [address]    Special Requests   Final    BOTTLES DRAWN AEROBIC AND ANAEROBIC Blood Culture results may not be optimal due to an inadequate volume of blood received in culture bottles Performed at [facility], [address] [place], [place] [address]    Culture   Final    NO GROWTH 2 DAYS Performed at [facility], [address] [place], [place] [address]    Report  Status PENDING  Incomplete     Time coordinating discharge: 25  minutes  SIGNED: Lanae Boast, MD  [facility] 03/31/2020, 11:08 AM  If 7PM-7AM, please contact night-coverage [email or web address]

## 2020-03-31 NOTE — TOC Transition Note (Signed)
Transition of Care [facility]) - CM/SW Discharge Note   Patient Details  Name: Kevin Parker MRN: [id number] Date of Birth: Feb 11, 1976  Transition of Care [facility]) CM/SW Contact:  Lennart Pall, LCSW Phone Number: 03/31/2020, 12:19 PM   Clinical Narrative:    Met with pt today to review dc support available for primary medical care and SA concerns.  Pt very pleasant and talks openly with me about his ongoing SA issues.  He has been to inpatient treatment centers in the past and outpatient programs.  I have provided him with handouts on local SA programs, needle exchange program and s/s of overdose.  Pt has never established with a primary care despite a referral for him last year to [place] (he "went out of town for a little while").  Per his agreement, I was able to secure another new patient appointment for him at [facility] for 10/5 @ 9:30 - handout also provided for this.  Pt plans to d/c today to home with friend.  No further TOC needs.   Final next level of care: Home/Self Care Barriers to Discharge: Barriers Resolved   Patient Goals and CMS Choice Patient states their goals for this hospitalization and ongoing recovery are:: go home today      Discharge Placement                       Discharge Plan and Services                DME Arranged: N/A DME Agency: NA       HH Arranged: NA HH Agency: NA        Social Determinants of Health (SDOH) Interventions     Readmission Risk Interventions Readmission Risk Prevention Plan 03/31/2020  Post Dischage Appt Complete  Medication Screening Complete  Transportation Screening Complete  Some recent data might be hidden

## 2020-04-03 LAB — CULTURE, BLOOD (ROUTINE X 2): Culture: NO GROWTH

## 2020-05-09 ENCOUNTER — Other Ambulatory Visit: Payer: Self-pay

## 2020-05-09 ENCOUNTER — Encounter: Payer: Self-pay | Admitting: Nurse Practitioner

## 2020-05-09 ENCOUNTER — Ambulatory Visit: Payer: Self-pay | Attending: Nurse Practitioner | Admitting: Nurse Practitioner

## 2020-05-09 VITALS — Ht 71.5 in | Wt 165.0 lb

## 2020-05-09 DIAGNOSIS — F419 Anxiety disorder, unspecified: Secondary | ICD-10-CM

## 2020-05-09 DIAGNOSIS — Z8619 Personal history of other infectious and parasitic diseases: Secondary | ICD-10-CM

## 2020-05-09 DIAGNOSIS — F32A Depression, unspecified: Secondary | ICD-10-CM

## 2020-05-09 DIAGNOSIS — Z7689 Persons encountering health services in other specified circumstances: Secondary | ICD-10-CM

## 2020-05-09 DIAGNOSIS — Z131 Encounter for screening for diabetes mellitus: Secondary | ICD-10-CM

## 2020-05-09 DIAGNOSIS — Z130 Encounter for screening for diseases of the blood and blood-forming organs and certain disorders involving the immune mechanism: Secondary | ICD-10-CM

## 2020-05-09 NOTE — Progress Notes (Signed)
Virtual Visit via Telephone Note Due to national recommendations of social distancing due to COVID 19, telehealth visit is felt to be most appropriate for this patient at this time.  I discussed the limitations, risks, security and privacy concerns of performing an evaluation and management service by telephone and the availability of in person appointments. I also discussed with the patient that there may be a patient responsible charge related to this service. The patient expressed understanding and agreed to proceed.    I connected with Jeannie Done on 05/09/20  at   9:30 AM EDT  EDT by telephone and verified that I am speaking with the correct person using two identifiers.   Consent I discussed the limitations, risks, security and privacy concerns of performing an evaluation and management service by telephone and the availability of in person appointments. I also discussed with the patient that there may be a patient responsible charge related to this service. The patient expressed understanding and agreed to proceed.   Location of Patient: Private Residence    Location of Provider: [facility]    Persons participating in Telemedicine visit: Bertram Denver FNP-BC [place] CMA Janyth Pupa [employer]    History of Present Illness: Telemedicine visit for: Establish Care PMH: ADD, Anxiety, Chronic hepatitis C, Depression, Heroin abuse, and IV drug user.   Cellulitis Admitted for 2 days on 8-25 with recurrent arm cellulitis after failing a previous OP treatment of DOXY. Required IV abx (vanc/cefepime). Due to substance abuse he was treated with clonidine for withdrawal symptoms.  Today he states cellulitis has completely resolved. Blood cultures negative.    Anxiety and Depression States he has pretty much tried everything you can think of. Previous medications: xanax, hydroxyzine, buspar, effexor, trazodone, ambien, celexa.    Depression screen [facility] 2/9 05/09/2020 06/04/2016  Decreased Interest 0 0  Down, Depressed, Hopeless 0 1  PHQ - 2 Score 0 1  Altered sleeping 1 -  Tired, decreased energy 1 -  Change in appetite 1 -  Feeling bad or failure about yourself  0 -  Trouble concentrating 0 -  Moving slowly or fidgety/restless 0 -  Suicidal thoughts 0 -  PHQ-9 Score 3 -   GAD 7 : Generalized Anxiety Score 05/09/2020 06/04/2016  Nervous, Anxious, on Edge 0 3  Control/stop worrying 2 3  Worry too much - different things 2 3  Trouble relaxing 2 3  Restless 2 2  Easily annoyed or irritable 1 2  Afraid - awful might happen 1 2  Total GAD 7 Score 10 18     Past Medical History:  Diagnosis Date   ADD (attention deficit disorder)    Anxiety    Chronic hepatitis C (HCC)    Depression    Heroin abuse (HCC)    IV drug user     Past Surgical History:  Procedure Laterality Date   APPENDECTOMY      Family History  Problem Relation Age of Onset   Healthy Mother    Healthy Father    ADD / ADHD Father    Diabetes Father    Hypertension Neg Hx     Social History   Socioeconomic History   Marital status: Single    Spouse name: Not on file   Number of children: Not on file   Years of education: Not on file   Highest education level: Not on file  Occupational History   Not on file  Tobacco Use  Smoking status: Current Every Day Smoker    Packs/day: 1.00    Types: Cigarettes   Smokeless tobacco: Never Used  Vaping Use   Vaping Use: Never used  Substance and Sexual Activity   Alcohol use: Not Currently    Comment: weekly   Drug use: Yes    Types: Marijuana, IV    Comment: heroin and Fentanyl   Sexual activity: Not on file  Other Topics Concern   Not on file  Social History Narrative   Not on file   Social Determinants of Health   Financial Resource Strain:    Difficulty of Paying Living Expenses: Not on file  Food Insecurity:    Worried About Running Out of  Food in the Last Year: Not on file   Ran Out of Food in the Last Year: Not on file  Transportation Needs:    Lack of Transportation (Medical): Not on file   Lack of Transportation (Non-Medical): Not on file  Physical Activity:    Days of Exercise per Week: Not on file   Minutes of Exercise per Session: Not on file  Stress:    Feeling of Stress : Not on file  Social Connections:    Frequency of Communication with Friends and Family: Not on file   Frequency of Social Gatherings with Friends and Family: Not on file   Attends Religious Services: Not on file   Active Member of Clubs or Organizations: Not on file   Attends Banker Meetings: Not on file   Marital Status: Not on file     Observations/Objective: Awake, alert and oriented x 3   Review of Systems  Constitutional: Negative for fever, malaise/fatigue and weight loss.  HENT: Negative.  Negative for nosebleeds.   Eyes: Negative.  Negative for blurred vision, double vision and photophobia.  Respiratory: Negative.  Negative for cough and shortness of breath.   Cardiovascular: Negative.  Negative for chest pain, palpitations and leg swelling.  Gastrointestinal: Negative.  Negative for heartburn, nausea and vomiting.  Musculoskeletal: Negative.  Negative for myalgias.  Neurological: Negative.  Negative for dizziness, focal weakness, seizures and headaches.  Psychiatric/Behavioral: Positive for depression. Negative for suicidal ideas. The patient is nervous/anxious.     Assessment and Plan: Joniel was seen today for hospitalization follow-up.  Diagnoses and all orders for this visit:  Encounter to establish care  History of hepatitis C -     HCV RNA quant; Future  Screening for deficiency anemia -     CBC; Future  Encounter for screening for diabetes mellitus -     Hemoglobin A1c; Future  Anxiety and depression Declines anxiolytic today  Follow Up Instructions Return for  Fasting labs.      I discussed the assessment and treatment plan with the patient. The patient was provided an opportunity to ask questions and all were answered. The patient agreed with the plan and demonstrated an understanding of the instructions.   The patient was advised to call back or seek an in-person evaluation if the symptoms worsen or if the condition fails to improve as anticipated.  I provided 16 minutes of non-face-to-face time during this encounter including median intraservice time, reviewing previous notes, labs, imaging, medications and explaining diagnosis and management.  Claiborne Rigg, FNP-BC

## 2020-05-11 ENCOUNTER — Other Ambulatory Visit: Payer: Self-pay

## 2020-05-11 ENCOUNTER — Encounter: Payer: Self-pay | Admitting: Nurse Practitioner

## 2020-06-12 ENCOUNTER — Emergency Department (HOSPITAL_COMMUNITY)
Admission: EM | Admit: 2020-06-12 | Discharge: 2020-06-12 | Disposition: A | Discharge status: Home / Self Care | Payer: Self-pay | Attending: Emergency Medicine | Admitting: Emergency Medicine

## 2020-06-12 ENCOUNTER — Other Ambulatory Visit (HOSPITAL_COMMUNITY): Payer: Self-pay | Admitting: Student

## 2020-06-12 ENCOUNTER — Other Ambulatory Visit: Payer: Self-pay

## 2020-06-12 DIAGNOSIS — F17210 Nicotine dependence, cigarettes, uncomplicated: Secondary | ICD-10-CM | POA: Insufficient documentation

## 2020-06-12 DIAGNOSIS — K047 Periapical abscess without sinus: Secondary | ICD-10-CM | POA: Insufficient documentation

## 2020-06-12 MED ORDER — AMOXICILLIN-POT CLAVULANATE 875-125 MG PO TABS
1.0000 | ORAL_TABLET | Freq: Once | ORAL | Status: AC
  Administered 2020-06-12: 1 via ORAL
  Filled 2020-06-12: qty 1

## 2020-06-12 MED ORDER — AMOXICILLIN-POT CLAVULANATE 875-125 MG PO TABS: 1.0000 | ORAL_TABLET | Freq: Two times a day (BID) | ORAL | 0 refills | Status: DC

## 2020-06-12 MED FILL — AMOX-CLAV 875-125 MG TABLET: 875-125 | 7 days supply | Qty: 14 | Fill #0

## 2020-06-12 NOTE — Discharge Instructions (Signed)
Take antibiotics as prescribed.  Take entire course, even if your symptoms improve. Use Tylenol ibuprofen as needed for pain. Use ice to help with pain and swelling. Follow-up with your dentist for further management of your tooth. Return to the emergency room if you develop inability to open your mouth, difficulty swallowing or handling your secretions, or any new, worsening, or concerning symptoms.

## 2020-06-12 NOTE — ED Provider Notes (Signed)
[facility]-EMERGENCY DEPT Provider Note   CSN: [id number] Arrival date & time: 06/12/20  1459     History Chief Complaint  Patient presents with  . Abscess  . Dental Pain    Kevin Parker is a 44 y.o. male presenting for evaluation of dental pain and facial swelling.  Patient states his symptoms began 2 days ago.  There has been gradually worsening.  He reports pain in the right lower tooth and cheek.  He has not taken anything for it including Tylenol ibuprofen.  He states that he has a history of tooth infections, this feels similar.  He does have a dentist, but has not followed up with him.  He denies fevers, chills, nausea, vomiting.  He denies difficulty opening his mouth or difficulty swallowing.  Pain is constant, nothing makes it better or worse.  HPI     Past Medical History:  Diagnosis Date  . ADD (attention deficit disorder)   . Anxiety   . Chronic hepatitis C (HCC)   . Depression   . Heroin abuse (HCC)   . IV drug user     Patient Active Problem List   Diagnosis Date Noted  . Cellulitis of right forearm 03/29/2020  . 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 Severe episode of recurrent major depressive disorder, without psychotic features (HCC) 03/27/2020  . Heroin use 06/12/2016  . Polysubstance abuse (HCC) 06/12/2016  . Hepatitis C virus infection 06/12/2016  . ETOH abuse 06/12/2016  . Substance addiction (HCC) 06/18/2011    Past Surgical History:  Procedure Laterality Date  . APPENDECTOMY         Family History  Problem Relation Age of Onset  . Healthy Mother   . Healthy Father   . ADD / ADHD Father   . Diabetes Father   . Hypertension Neg Hx     Social History   Tobacco Use  . Smoking status: Current Every Day Smoker    Packs/day: 1.00    Types: Cigarettes  . Smokeless tobacco: Never Used  Vaping Use  . Vaping Use: Never used  Substance Use Topics  . Alcohol use: Not Currently    Comment: weekly    . Drug use: Yes    Types: Marijuana, IV    Comment: heroin and Fentanyl    Home Medications Prior to Admission medications   Medication Sig Start Date End Date Taking? Authorizing Provider  acetaminophen (TYLENOL) 500 MG tablet Take 1,000 mg by mouth every 6 (six) hours as needed for moderate pain.    [provider]  amoxicillin-clavulanate (AUGMENTIN) 875-125 MG tablet Take 1 tablet by mouth every 12 (twelve) hours. 06/12/20   Bright Spielmann, PA-C    Allergies    Patient has no known allergies.  Review of Systems   Review of Systems  Constitutional: Negative for fever.  HENT: Positive for dental problem and facial swelling.     Physical Exam Updated Vital Signs BP 130/83 (BP Location: Right Arm)   Pulse 100   Temp 97.8 F (36.6 C) (Oral)   Resp 15   Ht 5' 11.5" (1.816 m)   Wt 74.8 kg   SpO2 100%   BMI 22.69 kg/m   Physical Exam Vitals and nursing note reviewed.  Constitutional:      General: He is not in acute distress.    Appearance: He is well-developed.     Comments: Sitting in the chair no acute distress  HENT:     Head: Normocephalic and  atraumatic.     Mouth/Throat:     Dentition: Abnormal dentition. Dental tenderness, gingival swelling and dental caries present.     Comments: Overall poor dentition.  Multiple teeth are missing.  Right lower tooth tender with surrounding gingival swelling.  Swelling and induration extends at the base of the gingiva and into the cheek.  No pain under the tongue, floor the mouth is soft.  No trismus.  Handling secretions easily. Cardiovascular:     Rate and Rhythm: Normal rate and regular rhythm.     Pulses: Normal pulses.  Pulmonary:     Effort: Pulmonary effort is normal.     Breath sounds: Normal breath sounds.  Abdominal:     General: There is no distension.  Musculoskeletal:        General: Normal range of motion.     Cervical back: Normal range of motion.  Skin:    General: Skin is warm.     Findings:  No rash.  Neurological:     Mental Status: He is alert and oriented to person, place, and time.     ED Results / Procedures / Treatments   Labs (all labs ordered are listed, but only abnormal results are displayed) Labs Reviewed - No data to display  EKG None  Radiology No results found.  Procedures Procedures (including critical care time)  Medications Ordered in ED Medications  amoxicillin-clavulanate (AUGMENTIN) 875-125 MG per tablet 1 tablet (has no administration in time range)    ED Course  I have reviewed the triage vital signs and the nursing notes.  Pertinent labs & imaging results that were available during my care of the patient were reviewed by me and considered in my medical decision making (see chart for details).    MDM Rules/Calculators/A&P                          Patient presenting for dental pain and facial swelling.  On exam, patient is nontoxic.  Exam is consistent with infection, but not consistent with Ludwig's.  Will treat with antibiotics.  Patient concerned about cost of antibiotics, discussed with social work and patient to get his antibiotics for free.  At this time, patient appears safe for discharge.  Return precautions given.  Patient states he understands and agrees to plan.  Final Clinical Impression(s) / ED Diagnoses Final diagnoses:  Dental infection    Rx / DC Orders ED Discharge Orders         Ordered    amoxicillin-clavulanate (AUGMENTIN) 875-125 MG tablet  Every 12 hours        06/12/20 1614           Fawna Cranmer, PA-C 06/12/20 1622    Derwood Kaplan, MD 06/13/20 1747

## 2020-06-12 NOTE — ED Triage Notes (Signed)
Patient reports to the ER for Dental abscess that he said started yesterday

## 2020-06-12 NOTE — Progress Notes (Signed)
..   Transition of Care [facility]) - Emergency Department Mini Assessment   Patient Details  Name: Kevin Parker MRN: [id number] Date of Birth: 1975/12/30  [facility] CM/SW Contact:    Elliot Cousin, RN Phone Number: [phone] 06/12/2020, 4:51 PM   Clinical Narrative: [facility] CM sent MATCH letter to [facility]. Pt has used MATCH in the past. He has appt on 06/19/2020 at 410 pm at [facility].    ED Mini Assessment: What brought you to the Emergency Department? : dental pain  Barriers to Discharge: No Barriers Identified     Means of departure: Public Transportation  Interventions which prevented an admission or readmission: Medication Review    Patient Contact and Communications        ,                 Admission diagnosis:  abscess Patient Active Problem List   Diagnosis Date Noted  . Cellulitis of right forearm 03/29/2020  . 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 Severe episode of recurrent major depressive disorder, without psychotic features (HCC) 03/27/2020  . Heroin use 06/12/2016  . Polysubstance abuse (HCC) 06/12/2016  . Hepatitis C virus infection 06/12/2016  . ETOH abuse 06/12/2016  . Substance addiction (HCC) 06/18/2011   PCP:  Claiborne Rigg, NP Pharmacy:   [facility] - [place], [place] - [place] [address] [place] [address] Phone: [phone] Fax: [phone]

## 2020-06-19 ENCOUNTER — Encounter: Payer: Self-pay | Admitting: Nurse Practitioner

## 2020-11-28 ENCOUNTER — Encounter: Payer: Self-pay | Admitting: Nurse Practitioner

## 2020-12-24 IMAGING — CT CT FOREARM*R* W/CM
1 series · 12 of 14 positions shown, 15 images · IV contrast (agent unspecified)
Comparison: None.

CONTRAST:  100 mL OMNIPAQUE IOHEXOL 300 MG/ML  SOLN

CLINICAL DATA: Right forearm pain, swelling and redness for 3 days
and an IV drug abuser.

EXAM:
CT OF THE UPPER RIGHT EXTREMITY WITH CONTRAST
TECHNIQUE: Multidetector CT imaging of the upper right extremity was performed
according to the standard protocol following intravenous contrast
administration.

[Series 3: axial st · axial · 0.29mm/px · z∈[+836,+1106]mm · 12 of 161 slices shown, 15 images]
[im 13/161  soft-tissue]
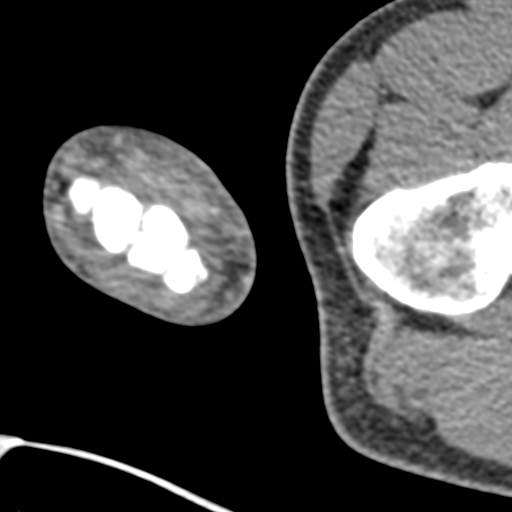
[im 13/161  bone]
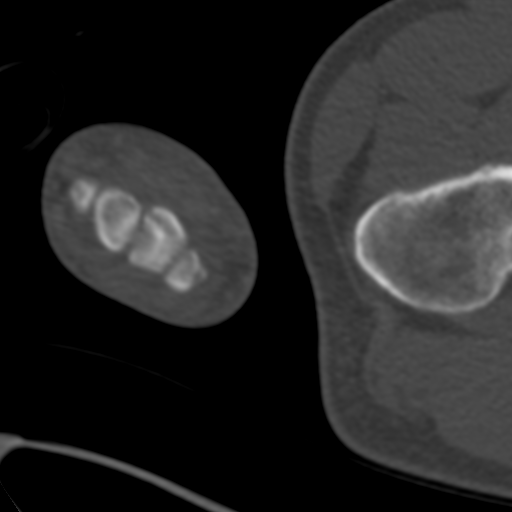
[im 25/161  bone]
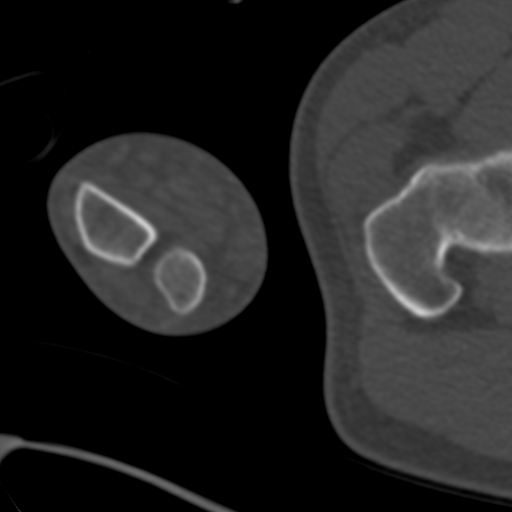
[im 37/161  bone]
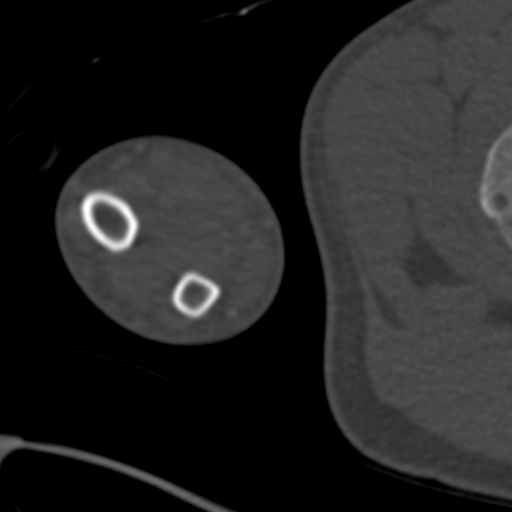
[im 50/161  bone]
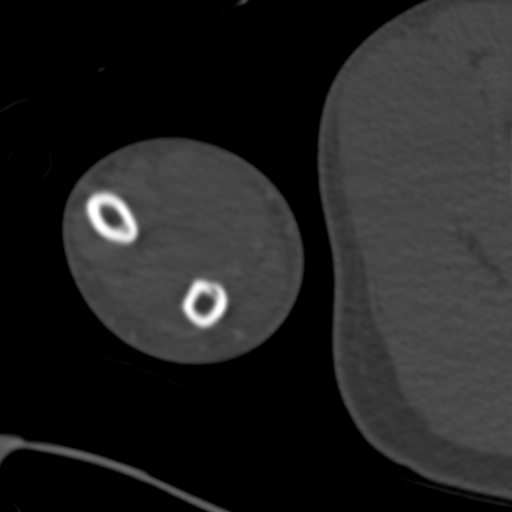
[im 62/161  soft-tissue]
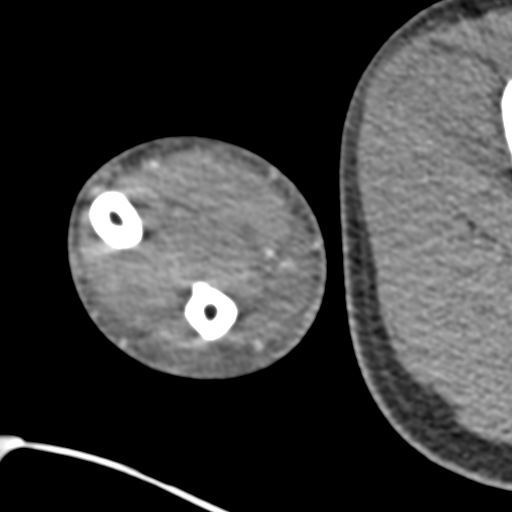
[im 62/161  bone]
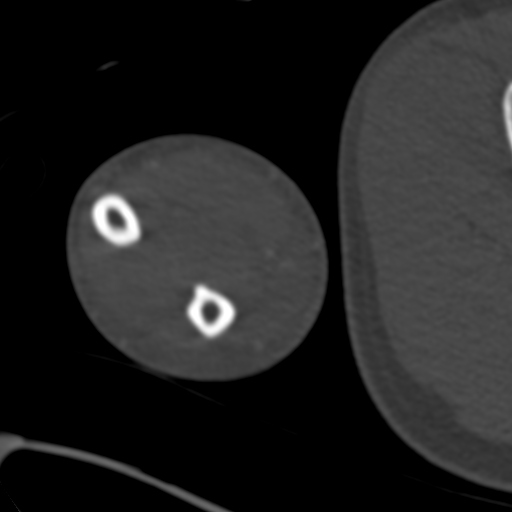
[im 74/161  bone]
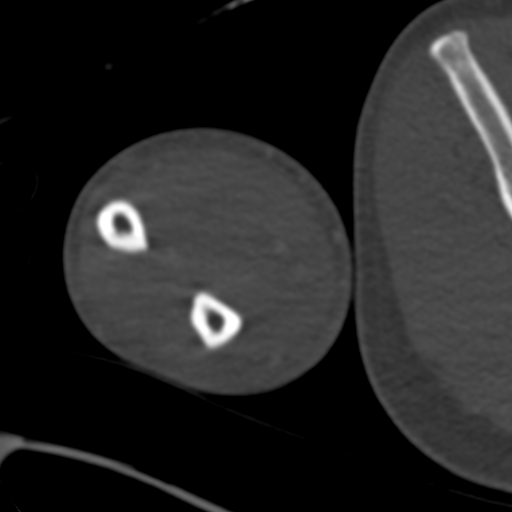
[im 87/161  bone]
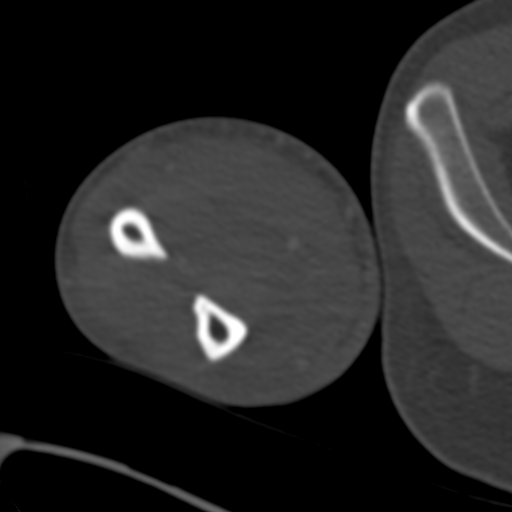
[im 99/161  bone]
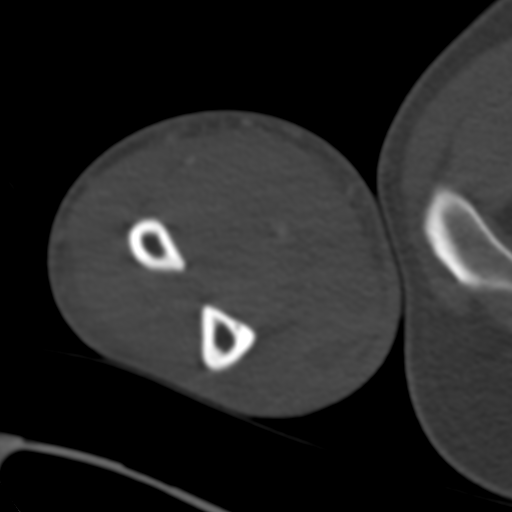
[im 111/161  soft-tissue]
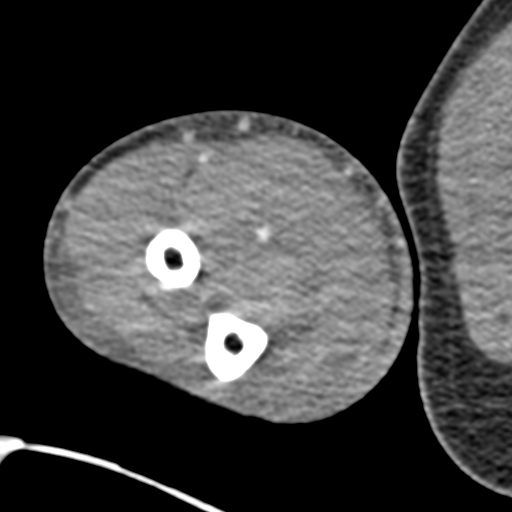
[im 111/161  bone]
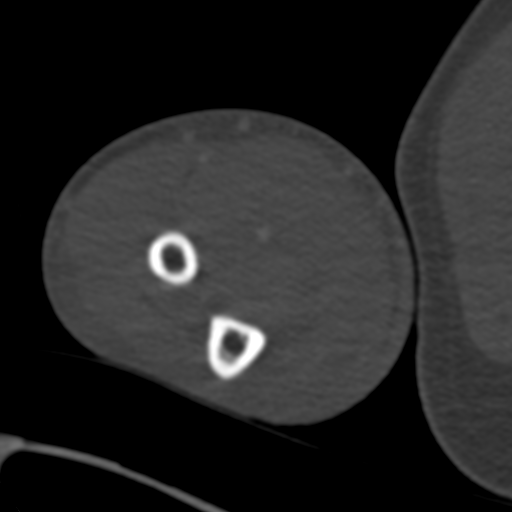
[im 124/161  bone]
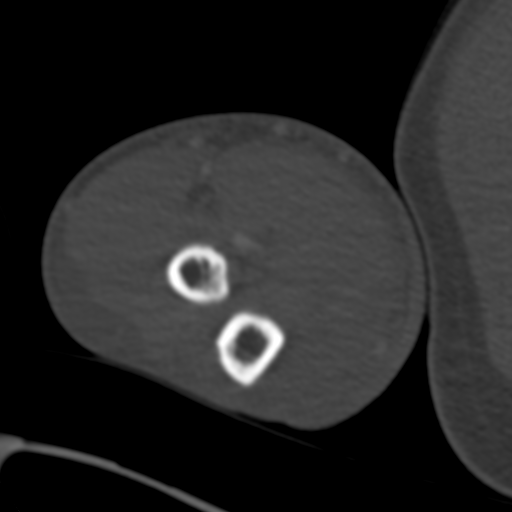
[im 136/161  bone]
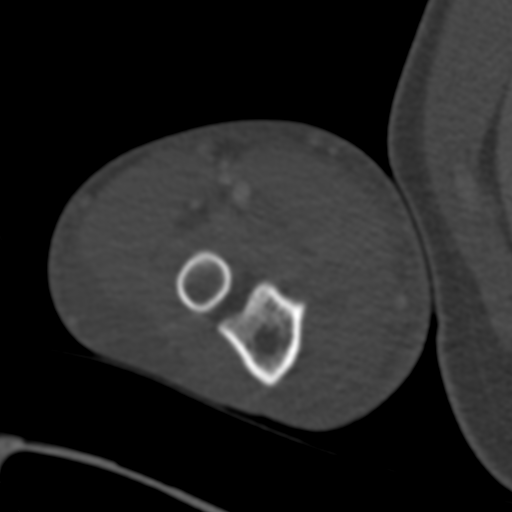
[im 148/161  bone]
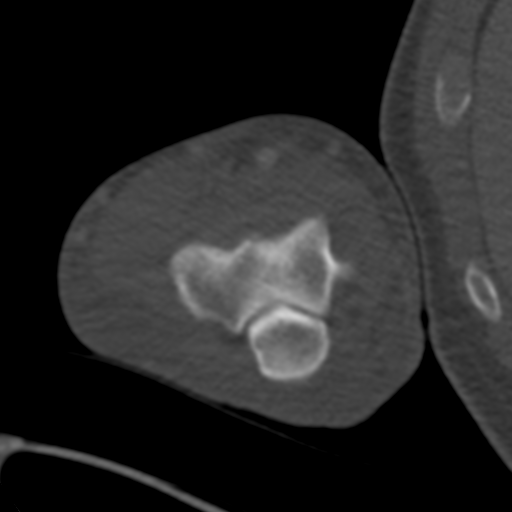

[12 of 14 positions shown; findings below may reference images not displayed]

FINDINGS: Bones/Joint/Cartilage

Appear normal throughout. No bony destructive change, periosteal
reaction, fracture or focal lesion.

Ligaments

Suboptimally assessed by CT.

Muscles and Tendons

No intramuscular fluid collection is identified. No gas within
muscle or tracking along fascial planes. No evidence of strain or
tear.

Soft tissues

Stranding in subcutaneous fatty tissues is compatible with
cellulitis. No focal fluid collection is identified. Major vascular
structures opacify normally.
IMPRESSION: Findings compatible with cellulitis. Negative for abscess or
osteomyelitis.

## 2021-01-01 ENCOUNTER — Other Ambulatory Visit (HOSPITAL_COMMUNITY): Payer: Self-pay

## 2021-01-01 ENCOUNTER — Emergency Department (HOSPITAL_COMMUNITY)
Admission: EM | Admit: 2021-01-01 | Discharge: 2021-01-01 | Disposition: A | Discharge status: Home / Self Care | Payer: Self-pay | Attending: Emergency Medicine | Admitting: Emergency Medicine

## 2021-01-01 ENCOUNTER — Encounter (HOSPITAL_COMMUNITY): Payer: Self-pay | Admitting: Emergency Medicine

## 2021-01-01 DIAGNOSIS — F17210 Nicotine dependence, cigarettes, uncomplicated: Secondary | ICD-10-CM | POA: Insufficient documentation

## 2021-01-01 DIAGNOSIS — L0211 Cutaneous abscess of neck: Secondary | ICD-10-CM | POA: Insufficient documentation

## 2021-01-01 MED ORDER — SULFAMETHOXAZOLE-TRIMETHOPRIM 800-160 MG PO TABS
1.0000 | ORAL_TABLET | Freq: Once | ORAL | Status: AC
  Administered 2021-01-01: 1 via ORAL
  Filled 2021-01-01: qty 1

## 2021-01-01 MED ORDER — KETOROLAC TROMETHAMINE 60 MG/2ML IM SOLN
30.0000 mg | Freq: Once | INTRAMUSCULAR | Status: AC
  Administered 2021-01-01: 30 mg via INTRAMUSCULAR
  Filled 2021-01-01: qty 2

## 2021-01-01 MED ORDER — SULFAMETHOXAZOLE-TRIMETHOPRIM 800-160 MG PO TABS
1.0000 | ORAL_TABLET | Freq: Two times a day (BID) | ORAL | 0 refills | Status: AC
  Filled 2021-01-01 – 2021-03-26 (×3): qty 14, 7d supply, fill #0

## 2021-01-01 NOTE — Discharge Instructions (Addendum)
Thank you for allowing me to care for you today in the Emergency Department.   Take 1 tablet of Bactrim 2 times daily for the next week.  Your first dose was given in the emergency department.  Apply a warm compress/washcloth to the area for 15-20 minutes up to 3-4 times per day.   Take 650 mg of Tylenol or 600 mg of ibuprofen with food every 6 hours for pain.  You can alternate between these 2 medications every 3 hours if your pain returns.  For instance, you can take Tylenol at noon, followed by a dose of ibuprofen at 3, followed by second dose of Tylenol and 6.  You can follow-up with [facility] for recheck.  You should stop using IV drugs.  Return to the emergency department If you become unable to move your neck, develop red streaking from the wound, If you start having fevers or chills after you have been on antibiotics for more than a couple days, if you develop shortness of breath, severe chest pain, new numbness or weakness, or other new, concerning symptoms.

## 2021-01-01 NOTE — ED Provider Notes (Signed)
[name]-EMERGENCY DEPT Provider Note   CSN: [id number] Arrival date & time: 01/01/21  2143     History Chief Complaint  Patient presents with  . Abscess    Kevin Parker is a 45 y.o. male with a history of IV heroin use, IV methamphetamine use, polysubstance use disorder, chronic hepatitis C, and alcohol use disorder who presents the emergency department with a chief complaint of abscess.  The patient reports that 2 nights ago and a shower that he was feeling on the back of his neck and felt a small bump.  Over the last 2 days, the area has become increasingly painful and more swollen.  He has a history of previous abscesses and says that this felt like an abscess.  Pain is constant, 10 out of 10, sharp and achy.  No known aggravating or alleviating factors.  He took Tylenol this afternoon without improvement.  No fever, chills, confusion, numbness, weakness, back pain, neck stiffness, URI symptoms, chest pain, shortness of breath.   The history is provided by the patient and medical records. No language interpreter was used.       Past Medical History:  Diagnosis Date  . ADD (attention deficit disorder)   . Anxiety   . Chronic hepatitis C (HCC)   . Depression   . Heroin abuse (HCC)   . IV drug user     Patient Active Problem List   Diagnosis Date Noted  . Cellulitis of right forearm 03/29/2020  . 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 Severe episode of recurrent major depressive disorder, without psychotic features (HCC) 03/27/2020  . Heroin use 06/12/2016  . Polysubstance abuse (HCC) 06/12/2016  . Hepatitis C virus infection 06/12/2016  . ETOH abuse 06/12/2016  . Substance addiction (HCC) 06/18/2011    Past Surgical History:  Procedure Laterality Date  . APPENDECTOMY         Family History  Problem Relation Age of Onset  . Healthy Mother   . Healthy Father   . ADD / ADHD Father   . Diabetes Father   . Hypertension  Neg Hx     Social History   Tobacco Use  . Smoking status: Current Every Day Smoker    Packs/day: 1.00    Types: Cigarettes  . Smokeless tobacco: Never Used  Vaping Use  . Vaping Use: Never used  Substance Use Topics  . Alcohol use: Not Currently    Comment: weekly  . Drug use: Yes    Types: Marijuana, IV    Comment: heroin and Fentanyl    Home Medications Prior to Admission medications   Medication Sig Start Date End Date Taking? Authorizing Provider  ibuprofen (ADVIL) 200 MG tablet Take 200-400 mg by mouth every 6 (six) hours as needed for mild pain.   Yes [provider]  sulfamethoxazole-trimethoprim (BACTRIM DS) 800-160 MG tablet Take 1 tablet by mouth 2 (two) times daily for 7 days. 01/01/21 01/08/21 Yes Rahaf Carbonell A, PA-C    Allergies    Patient has no known allergies.  Review of Systems   Review of Systems  Constitutional: Negative for appetite change, diaphoresis and fever.  HENT: Negative for congestion and sore throat.   Respiratory: Negative for cough and shortness of breath.   Cardiovascular: Negative for chest pain.  Gastrointestinal: Negative for abdominal pain, constipation, diarrhea, nausea and vomiting.  Genitourinary: Negative for dysuria.  Musculoskeletal: Positive for myalgias and neck pain. Negative for back pain.  Skin: Positive for wound. Negative  for color change and rash.  Allergic/Immunologic: Negative for immunocompromised state.  Neurological: Negative for dizziness, seizures, syncope, weakness, numbness and headaches.  Psychiatric/Behavioral: Negative for confusion.    Physical Exam Updated Vital Signs BP (!) 153/89   Pulse 80   Temp 97.6 F (36.4 C) (Oral)   Resp 14   Ht 5\' 11"  (1.803 m)   Wt 79.4 kg   SpO2 100%   BMI 24.41 kg/m   Physical Exam Vitals and nursing note reviewed.  Constitutional:      Appearance: He is well-developed.  HENT:     Head: Normocephalic.  Eyes:     Conjunctiva/sclera: Conjunctivae  normal.  Cardiovascular:     Rate and Rhythm: Normal rate and regular rhythm.     Heart sounds: No murmur heard.   Pulmonary:     Effort: Pulmonary effort is normal.  Abdominal:     General: There is no distension.     Palpations: Abdomen is soft.  Musculoskeletal:     Cervical back: Neck supple.  Skin:    General: Skin is warm and dry.     Comments: There is a 1 cm, firm area with minimal induration and a central ulceration noted to the midline of the posterior neck.  There is almost no fluctuance.  There is no surrounding erythema, warmth, red streaking.  Full active and passive range of motion of the neck.  No active drainage or drainage able to be expressed.  Neurological:     Mental Status: He is alert.  Psychiatric:        Behavior: Behavior normal.        ED Results / Procedures / Treatments   Labs (all labs ordered are listed, but only abnormal results are displayed) Labs Reviewed - No data to display  EKG None  Radiology No results found.  Procedures Ultrasound ED Soft Tissue  Date/Time: 01/01/2021 10:39 PM Performed by: 01/03/2021, PA-C Authorized by: Barkley Boards, PA-C   Procedure details:    Indications: localization of abscess     Transverse view:  Visualized   Longitudinal view:  Visualized   Images: archived   Location:    Location: neck     Side:  Midline Findings:     abscess present    no cellulitis present    no foreign body present     Medications Ordered in ED Medications  sulfamethoxazole-trimethoprim (BACTRIM DS) 800-160 MG per tablet 1 tablet (1 tablet Oral Given 01/01/21 2237)  ketorolac (TORADOL) injection 30 mg (30 mg Intramuscular Given 01/01/21 2237)    ED Course  I have reviewed the triage vital signs and the nursing notes.  Pertinent labs & imaging results that were available during my care of the patient were reviewed by me and considered in my medical decision making (see chart for details).    MDM  Rules/Calculators/A&P                          45 year old with a history of IV heroin use, IV methamphetamine use, polysubstance use disorder, chronic hepatitis C, and alcohol use disorder who presents to the emergency department with an abscess to the posterior neck for the last 2 days.  He has no constitutional symptoms.  He has a history of IV drug use, but does not inject near his neck.  He typically injects in the [facility] region.  He is having no other associated symptoms.  Vital signs are stable.  Bedside ultrasound performed.  There is not a significant fluid collection amenable to I&D at this time.  Patient initially expressed concern for cost of antibiotics, but stated that he could obtain a prescription for less than $10.  Will order Bactrim.  First dose given in the ER.  Recommended warm compresses.  He can follow-up with [place] and wellness for wound recheck.  Doubt myositis, bacteremia, endocarditis, spinal abscess, or necrotic lymph node.  ER return precautions given.  He is hemodynamically stable no acute distress.  Safe for discharge home with outpatient follow-up as discussed.  Final Clinical Impression(s) / ED Diagnoses Final diagnoses:  Abscess of skin of neck    Rx / DC Orders ED Discharge Orders         Ordered    sulfamethoxazole-trimethoprim (BACTRIM DS) 800-160 MG tablet  2 times daily        01/01/21 2224           Arlett Goold A, PA-C 01/01/21 2243    Virgina Norfolk, DO 01/01/21 2249

## 2021-01-01 NOTE — ED Triage Notes (Signed)
Patient c/o abscess to posterior neck pain x2 days. Reports hx of same.

## 2021-01-02 ENCOUNTER — Other Ambulatory Visit (HOSPITAL_COMMUNITY): Payer: Self-pay

## 2021-01-10 ENCOUNTER — Other Ambulatory Visit (HOSPITAL_COMMUNITY): Payer: Self-pay

## 2021-02-08 ENCOUNTER — Other Ambulatory Visit (HOSPITAL_COMMUNITY): Payer: Self-pay

## 2021-02-16 ENCOUNTER — Other Ambulatory Visit (HOSPITAL_COMMUNITY): Payer: Self-pay

## 2021-03-26 ENCOUNTER — Other Ambulatory Visit (HOSPITAL_COMMUNITY): Payer: Self-pay

## 2021-04-04 ENCOUNTER — Other Ambulatory Visit (HOSPITAL_COMMUNITY): Payer: Self-pay

## 2021-12-05 ENCOUNTER — Other Ambulatory Visit: Payer: Self-pay

## 2021-12-05 ENCOUNTER — Emergency Department (HOSPITAL_COMMUNITY)
Admission: EM | Admit: 2021-12-05 | Discharge: 2021-12-05 | Payer: Commercial Managed Care - HMO | Attending: Emergency Medicine | Admitting: Emergency Medicine

## 2021-12-05 ENCOUNTER — Encounter (HOSPITAL_COMMUNITY): Payer: Self-pay | Admitting: Emergency Medicine

## 2021-12-05 DIAGNOSIS — Z79899 Other long term (current) drug therapy: Secondary | ICD-10-CM | POA: Insufficient documentation

## 2021-12-05 DIAGNOSIS — R000 Tachycardia, unspecified: Secondary | ICD-10-CM | POA: Diagnosis not present

## 2021-12-05 DIAGNOSIS — F1910 Other psychoactive substance abuse, uncomplicated: Secondary | ICD-10-CM | POA: Diagnosis present

## 2021-12-05 DIAGNOSIS — Z0279 Encounter for issue of other medical certificate: Secondary | ICD-10-CM | POA: Insufficient documentation

## 2021-12-05 LAB — CBC
HCT: 39.5 % (ref 39.0–52.0)
Hemoglobin: 12.8 g/dL — ABNORMAL LOW (ref 13.0–17.0)
MCH: 28.6 pg (ref 26.0–34.0)
MCHC: 32.4 g/dL (ref 30.0–36.0)
MCV: 88.2 fL (ref 80.0–100.0)
Platelets: 243 10*3/uL (ref 150–400)
RBC: 4.48 MIL/uL (ref 4.22–5.81)
RDW: 15.6 % — ABNORMAL HIGH (ref 11.5–15.5)
WBC: 7.3 10*3/uL (ref 4.0–10.5)
nRBC: 0 % (ref 0.0–0.2)

## 2021-12-05 LAB — BASIC METABOLIC PANEL
Anion gap: 7 (ref 5–15)
BUN: 24 mg/dL — ABNORMAL HIGH (ref 6–20)
CO2: 29 mmol/L (ref 22–32)
Calcium: 9.4 mg/dL (ref 8.9–10.3)
Chloride: 105 mmol/L (ref 98–111)
Creatinine, Ser: 0.98 mg/dL (ref 0.61–1.24)
GFR, Estimated: >60 mL/min (ref >60)
Glucose, Bld: 96 mg/dL (ref 70–99)
Potassium: 4.7 mmol/L (ref 3.5–5.1)
Sodium: 141 mmol/L (ref 135–145)

## 2021-12-05 LAB — RAPID URINE DRUG SCREEN, HOSP PERFORMED
Amphetamines: POSITIVE — AB
Barbiturates: NOT DETECTED
Benzodiazepines: NOT DETECTED
Cocaine: POSITIVE — AB
Opiates: NOT DETECTED
Tetrahydrocannabinol: POSITIVE — AB

## 2021-12-05 LAB — ETHANOL: Alcohol, Ethyl (B): <10 mg/dL (ref <10)

## 2021-12-05 NOTE — ED Triage Notes (Signed)
Pt in custody of [employer].  Needs medical clearance d/t odd behavior before going to jail.  ?

## 2021-12-05 NOTE — ED Provider Triage Note (Signed)
Emergency Medicine Provider Triage Evaluation Note ? ?Kevin Parker , a 46 y.o. male  was evaluated in triage.  Pt is brought in by [employer].  Patient was found roaming the neighborhood.  He endorses using drugs prior to arrival.  Place is taken into jail and wanted him evaluated first.  He endorses amphetamine, narcotics.  He does appear intoxicated during the interview.  Will evaluate with basic labs, UDS, ethanol level.  Denies chest pain, or any other complaints at this time. ? ?Review of Systems  ?Positive: As above ?Negative: As above ? ?Physical Exam  ?BP 139/86 (BP Location: Right Arm)   Pulse (!) 105   Temp 98.5 ?F (36.9 ?C) (Oral)   Resp 16   SpO2 100%  ?Gen:   Awake, no distress   ?Resp:  Normal effort  ?MSK:   Moves extremities without difficulty  ?Other:   ? ?Medical Decision Making  ?Medically screening exam initiated at 12:19 AM.  Appropriate orders placed.  Onofrio Klemp was informed that the remainder of the evaluation will be completed by another provider, this initial triage assessment does not replace that evaluation, and the importance of remaining in the ED until their evaluation is complete. ? ? ?  ?Marita Kansas, PA-C ?12/05/21 0020 ? ?

## 2021-12-05 NOTE — ED Provider Notes (Signed)
?[facility] EMERGENCY DEPARTMENT ?Provider Note ? ? ?CSN: [id number] ?Arrival date & time: 12/05/21  0010 ? ?  ? ?History ? ?Chief Complaint  ?Patient presents with  ? Medical Clearance  ? ? ?Kevin Parker is a 46 y.o. male. ? ?46 year old male is brought in by [facility] for concern of overdose.  Patient was found roaming the neighborhood he was being chased by his friend to tell him to stop using drugs.  Patient was found using drugs.  Patient had warrants out for his arrest and prior to going to jail he was brought in for evaluation and medical clearance.  Patient denies any complaints.  ? ?The history is provided by the patient. No language interpreter was used.  ? ?  ? ?Home Medications ?Prior to Admission medications   ?Medication Sig Start Date End Date Taking? Authorizing Provider  ?ibuprofen (ADVIL) 200 MG tablet Take 200-400 mg by mouth every 6 (six) hours as needed for mild pain.    [provider]  ?   ? ?Allergies    ?Patient has no known allergies.   ? ?Review of Systems   ?Review of Systems  ?Respiratory:  Negative for shortness of breath.   ?Cardiovascular:  Negative for chest pain.  ?Gastrointestinal:  Negative for abdominal pain, nausea and vomiting.  ?Neurological:  Negative for syncope.  ?All other systems reviewed and are negative. ? ?Physical Exam ?Updated Vital Signs ?BP 139/86 (BP Location: Right Arm)   Pulse (!) 105   Temp 98.5 ?F (36.9 ?C) (Oral)   Resp 16   SpO2 100%  ?Physical Exam ?Vitals and nursing note reviewed.  ?Constitutional:   ?   General: He is not in acute distress. ?   Appearance: Normal appearance. He is not ill-appearing.  ?HENT:  ?   Head: Normocephalic and atraumatic.  ?   Nose: Nose normal.  ?Eyes:  ?   Conjunctiva/sclera: Conjunctivae normal.  ?Cardiovascular:  ?   Rate and Rhythm: Regular rhythm. Tachycardia present.  ?Pulmonary:  ?   Effort: Pulmonary effort is normal. No respiratory distress.  ?Abdominal:  ?   General: There is no  distension.  ?   Tenderness: There is no abdominal tenderness. There is no guarding.  ?Musculoskeletal:     ?   General: No deformity.  ?Skin: ?   Findings: No rash.  ?Neurological:  ?   Mental Status: He is alert.  ? ? ?ED Results / Procedures / Treatments   ?Labs ?(all labs ordered are listed, but only abnormal results are displayed) ?Labs Reviewed  ?CBC - Abnormal; Notable for the following components:  ?    Result Value  ? Hemoglobin 12.8 (*)   ? RDW 15.6 (*)   ? All other components within normal limits  ?BASIC METABOLIC PANEL - Abnormal; Notable for the following components:  ? BUN 24 (*)   ? All other components within normal limits  ?RAPID URINE DRUG SCREEN, HOSP PERFORMED - Abnormal; Notable for the following components:  ? Cocaine POSITIVE (*)   ? Amphetamines POSITIVE (*)   ? Tetrahydrocannabinol POSITIVE (*)   ? All other components within normal limits  ?ETHANOL  ? ? ?EKG ?None ? ?Radiology ?No results found. ? ?Procedures ?Procedures  ? ? ?Medications Ordered in ED ?Medications - No data to display ? ?ED Course/ Medical Decision Making/ A&P ?  ?                        ?  Medical Decision Making ?Amount and/or Complexity of Data Reviewed ?Labs: ordered. ? ? ?46 year old male presents today with [employer] for evaluation of concern for overdose following drug ingestion.  Patient endorses IV drug use.  He appears stable without acute distress.  At this point patient has been in the emergency room for about 2-1/2 hours where he remained stable without acute distress.  Respiratory wise he is stable.  No evidence of acute overdose.  CBC without leukocytosis.  Hemoglobin of 12.8 which is around his baseline.  BMP unremarkable with the exception of BUN of 24.  Alcohol level within normal.  UDS positive for cocaine, amphetamine, THC.  Patient is medically cleared and is appropriate for discharge. [employer] planning to take patient to jail given his outstanding warrants for arrest.  ? ? ?Final Clinical Impression(s) / ED  Diagnoses ?Final diagnoses:  ?Polysubstance abuse (HCC)  ? ? ?Rx / DC Orders ?ED Discharge Orders   ? ? None  ? ?  ? ? ?  ?Marita Kansas, PA-C ?12/05/21 5945 ? ?  ?Zadie Rhine, MD ?12/05/21 [phone] ? ?

## 2021-12-05 NOTE — Discharge Instructions (Addendum)
Your work-up today was reassuring.  No evidence of acute overdose.  For any concerning symptoms you can return to the emergency room for evaluation otherwise follow-up with your PCP. ?

## 2021-12-05 NOTE — ED Notes (Signed)
Pt sleeping. 

## 2022-03-08 ENCOUNTER — Emergency Department (HOSPITAL_COMMUNITY): Payer: Commercial Managed Care - HMO

## 2022-03-08 ENCOUNTER — Encounter (HOSPITAL_COMMUNITY): Payer: Self-pay | Admitting: Emergency Medicine

## 2022-03-08 ENCOUNTER — Emergency Department (HOSPITAL_COMMUNITY)
Admission: EM | Admit: 2022-03-08 | Discharge: 2022-03-08 | Disposition: A | Discharge status: Home / Self Care | Payer: Commercial Managed Care - HMO | Attending: Emergency Medicine | Admitting: Emergency Medicine

## 2022-03-08 ENCOUNTER — Other Ambulatory Visit: Payer: Self-pay

## 2022-03-08 DIAGNOSIS — F17210 Nicotine dependence, cigarettes, uncomplicated: Secondary | ICD-10-CM | POA: Diagnosis not present

## 2022-03-08 DIAGNOSIS — R4182 Altered mental status, unspecified: Secondary | ICD-10-CM | POA: Insufficient documentation

## 2022-03-08 DIAGNOSIS — T50901A Poisoning by unspecified drugs, medicaments and biological substances, accidental (unintentional), initial encounter: Secondary | ICD-10-CM | POA: Diagnosis present

## 2022-03-08 DIAGNOSIS — X58XXXA Exposure to other specified factors, initial encounter: Secondary | ICD-10-CM | POA: Insufficient documentation

## 2022-03-08 DIAGNOSIS — T402X1A Poisoning by other opioids, accidental (unintentional), initial encounter: Secondary | ICD-10-CM | POA: Insufficient documentation

## 2022-03-08 DIAGNOSIS — R791 Abnormal coagulation profile: Secondary | ICD-10-CM | POA: Diagnosis not present

## 2022-03-08 LAB — CBC WITH DIFFERENTIAL/PLATELET
Abs Immature Granulocytes: 0.15 10*3/uL — ABNORMAL HIGH (ref 0.00–0.07)
Basophils Absolute: 0 10*3/uL (ref 0.0–0.1)
Basophils Relative: 0 %
Eosinophils Absolute: 0 10*3/uL (ref 0.0–0.5)
Eosinophils Relative: 0 %
HCT: 41.7 % (ref 39.0–52.0)
Hemoglobin: 13.6 g/dL (ref 13.0–17.0)
Immature Granulocytes: 1 %
Lymphocytes Relative: 6 %
Lymphs Abs: 1.2 10*3/uL (ref 0.7–4.0)
MCH: 30.4 pg (ref 26.0–34.0)
MCHC: 32.6 g/dL (ref 30.0–36.0)
MCV: 93.3 fL (ref 80.0–100.0)
Monocytes Absolute: 2 10*3/uL — ABNORMAL HIGH (ref 0.1–1.0)
Monocytes Relative: 9 %
Neutro Abs: 18.4 10*3/uL — ABNORMAL HIGH (ref 1.7–7.7)
Neutrophils Relative %: 84 %
Platelets: 284 10*3/uL (ref 150–400)
RBC: 4.47 MIL/uL (ref 4.22–5.81)
RDW: 14.7 % (ref 11.5–15.5)
WBC: 21.7 10*3/uL — ABNORMAL HIGH (ref 4.0–10.5)
nRBC: 0 % (ref 0.0–0.2)

## 2022-03-08 LAB — BASIC METABOLIC PANEL
Anion gap: 6 (ref 5–15)
BUN: 22 mg/dL — ABNORMAL HIGH (ref 6–20)
CO2: 26 mmol/L (ref 22–32)
Calcium: 9.1 mg/dL (ref 8.9–10.3)
Chloride: 109 mmol/L (ref 98–111)
Creatinine, Ser: 1.08 mg/dL (ref 0.61–1.24)
GFR, Estimated: >60 mL/min (ref >60)
Glucose, Bld: 255 mg/dL — ABNORMAL HIGH (ref 70–99)
Potassium: 4.4 mmol/L (ref 3.5–5.1)
Sodium: 141 mmol/L (ref 135–145)

## 2022-03-08 LAB — PROTIME-INR
INR: 1.1 (ref 0.8–1.2)
Prothrombin Time: 14.5 seconds (ref 11.4–15.2)

## 2022-03-08 MED ORDER — NALOXONE HCL 4 MG/0.1ML NA LIQD: NASAL | 1 refills | Status: DC

## 2022-03-08 NOTE — ED Provider Notes (Signed)
[facility]-EMERGENCY DEPT Provider Note  CSN: [id number] Arrival date & time: 03/08/22 1254  Chief Complaint(s) Loss of Consciousness and Drug Overdose  HPI Kevin Parker is a 46 y.o. male with history of polysubstance abuse presenting to the emergency department after being given Narcan.  Per EMS, the patient was seen by bystanders to fall forward.  Was unresponsive.  Narcan given by EMS.  Patient needed some rescue breathing, no CPR.  Patient woke up on arrival to emergency department.  Patient currently denies any symptoms such as chest pain, shortness of breath, nausea, vomiting, abdominal pain, back pain, headache.  Does report that his front teeth are missing and he is not sure how this happened.  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 Authorizing Provider  ibuprofen (ADVIL) 200 MG tablet Take 200-400 mg by mouth every 6 (six) hours as needed for mild pain.   Yes [provider]  naloxone [facility]) nasal spray 4 mg/0.1 mL If you must use illegal drugs or opiates please use them around other people, and give them this medicine, so they can give it through the nose if you develop any overdose symptoms. 03/08/22  Yes Lonell Grandchild, MD                                                                                                                                     Past Surgical History Past Surgical History:  Procedure Laterality Date   APPENDECTOMY     Family History Family History  Problem Relation Age of Onset   Healthy Mother    Healthy Father    ADD / ADHD Father    Diabetes Father    Hypertension Neg Hx     Social History Social History   Tobacco Use   Smoking status: Every Day    Packs/day: 1.00    Types: Cigarettes   Smokeless tobacco: Never  Vaping Use   Vaping Use: Never used  Substance Use Topics   Alcohol use: Not Currently    Comment: weekly   Drug use: Yes    Types: Marijuana, IV    Comment: heroin and Fentanyl  Allergies Patient has no known allergies.  Review of Systems Review of Systems  All other systems reviewed and are negative.   Physical Exam Vital Signs  I have reviewed the triage vital signs BP 136/86   Pulse (!) 110   Temp 98.2 F (36.8 C) (Oral)   Resp 18   Ht 6' (1.829 m)   Wt 86.2 kg   SpO2 95%   BMI 25.77 kg/m   Physical Exam Vitals and nursing note reviewed.  Constitutional:      General: He is not in acute distress.    Appearance: Normal appearance.  HENT:     Head:     Comments: Missing upper 2 front teeth, mild swelling to lip    Mouth/Throat:     Mouth: Mucous membranes are moist.  Cardiovascular:     Rate and Rhythm: Normal rate and regular rhythm.  Pulmonary:     Effort: Pulmonary effort is normal. No respiratory distress.     Breath sounds: Normal breath sounds.  Abdominal:     General: Abdomen is flat.     Palpations: Abdomen is soft.     Tenderness: There is no abdominal tenderness.  Musculoskeletal:     Cervical back: No tenderness.  Skin:    General: Skin is warm and dry.     Capillary Refill: Capillary refill takes less than 2 seconds.  Neurological:     Mental Status: He is alert and oriented to person, place, and time. Mental status is at  baseline.  Psychiatric:        Mood and Affect: Mood normal.        Behavior: Behavior normal.     ED Results and Treatments Labs (all labs ordered are listed, but only abnormal results are displayed) Labs Reviewed  BASIC METABOLIC PANEL - Abnormal; Notable for the following components:      Result Value   Glucose, Bld 255 (*)    BUN 22 (*)    All other components within normal limits  CBC WITH DIFFERENTIAL/PLATELET - Abnormal; Notable for the following components:   WBC 21.7 (*)    Neutro Abs 18.4 (*)    Monocytes Absolute 2.0 (*)    Abs Immature Granulocytes 0.15 (*)    All other components within normal limits  PROTIME-INR                                                                                                                          Radiology CT Head Wo Contrast  Result Date: 03/08/2022 CLINICAL DATA:  Fall related to drug overdose EXAM: CT HEAD WITHOUT CONTRAST CT MAXILLOFACIAL WITHOUT CONTRAST CT CERVICAL SPINE WITHOUT CONTRAST TECHNIQUE: Multidetector CT imaging of the head, cervical spine, and maxillofacial structures were performed using the standard protocol without intravenous contrast. Multiplanar CT image reconstructions of the cervical spine and maxillofacial structures were also generated. RADIATION DOSE REDUCTION: This exam was performed according to the departmental dose-optimization program which includes automated exposure control, adjustment  of the mA and/or kV according to patient size and/or use of iterative reconstruction technique. COMPARISON:  None Available. FINDINGS: CT HEAD FINDINGS Brain: No evidence of acute infarct, hemorrhage, mass, mass effect, or midline shift. No hydrocephalus or extra-axial fluid collection. Gray-white differentiation is preserved. Vascular: No hyperdense vessel. Skull: Normal. Negative for fracture or focal lesion. CT MAXILLOFACIAL FINDINGS Osseous: No fracture or mandibular dislocation. Poor dentition with multifocal dental  caries and periapical lucencies. No additional destructive process. Orbits: Negative. No traumatic or inflammatory finding. Sinuses: Minimal mucosal thickening in the ethmoid air cells and right maxillary sinus. Otherwise clear paranasal sinuses. The mastoids are well aerated. Soft tissues: Negative. CT CERVICAL SPINE FINDINGS Alignment: No listhesis. Skull base and vertebrae: No acute fracture. No primary bone lesion or focal pathologic process. Soft tissues and spinal canal: No prevertebral fluid or swelling. No visible canal hematoma. Disc levels: Mild multilevel degenerative changes. No high-grade spinal canal stenosis. Upper chest: No focal pulmonary opacity or pleural effusion. Other: None. IMPRESSION: 1.  No acute intracranial process. 2.  No acute fracture or traumatic listhesis in the cervical spine. 3.  No evidence of facial bone fracture. Electronically Signed   By: Wiliam KeAlison  Vasan M.D.   On: 03/08/2022 14:10   CT Cervical Spine Wo Contrast  Result Date: 03/08/2022 CLINICAL DATA:  Fall related to drug overdose EXAM: CT HEAD WITHOUT CONTRAST CT MAXILLOFACIAL WITHOUT CONTRAST CT CERVICAL SPINE WITHOUT CONTRAST TECHNIQUE: Multidetector CT imaging of the head, cervical spine, and maxillofacial structures were performed using the standard protocol without intravenous contrast. Multiplanar CT image reconstructions of the cervical spine and maxillofacial structures were also generated. RADIATION DOSE REDUCTION: This exam was performed according to the departmental dose-optimization program which includes automated exposure control, adjustment of the mA and/or kV according to patient size and/or use of iterative reconstruction technique. COMPARISON:  None Available. FINDINGS: CT HEAD FINDINGS Brain: No evidence of acute infarct, hemorrhage, mass, mass effect, or midline shift. No hydrocephalus or extra-axial fluid collection. Gray-white differentiation is preserved. Vascular: No hyperdense vessel. Skull: Normal.  Negative for fracture or focal lesion. CT MAXILLOFACIAL FINDINGS Osseous: No fracture or mandibular dislocation. Poor dentition with multifocal dental caries and periapical lucencies. No additional destructive process. Orbits: Negative. No traumatic or inflammatory finding. Sinuses: Minimal mucosal thickening in the ethmoid air cells and right maxillary sinus. Otherwise clear paranasal sinuses. The mastoids are well aerated. Soft tissues: Negative. CT CERVICAL SPINE FINDINGS Alignment: No listhesis. Skull base and vertebrae: No acute fracture. No primary bone lesion or focal pathologic process. Soft tissues and spinal canal: No prevertebral fluid or swelling. No visible canal hematoma. Disc levels: Mild multilevel degenerative changes. No high-grade spinal canal stenosis. Upper chest: No focal pulmonary opacity or pleural effusion. Other: None. IMPRESSION: 1.  No acute intracranial process. 2.  No acute fracture or traumatic listhesis in the cervical spine. 3.  No evidence of facial bone fracture. Electronically Signed   By: Wiliam KeAlison  Vasan M.D.   On: 03/08/2022 14:10   CT Maxillofacial WO CM  Result Date: 03/08/2022 CLINICAL DATA:  Fall related to drug overdose EXAM: CT HEAD WITHOUT CONTRAST CT MAXILLOFACIAL WITHOUT CONTRAST CT CERVICAL SPINE WITHOUT CONTRAST TECHNIQUE: Multidetector CT imaging of the head, cervical spine, and maxillofacial structures were performed using the standard protocol without intravenous contrast. Multiplanar CT image reconstructions of the cervical spine and maxillofacial structures were also generated. RADIATION DOSE REDUCTION: This exam was performed according to the departmental dose-optimization program which includes automated exposure control, adjustment of the mA  and/or kV according to patient size and/or use of iterative reconstruction technique. COMPARISON:  None Available. FINDINGS: CT HEAD FINDINGS Brain: No evidence of acute infarct, hemorrhage, mass, mass effect, or midline  shift. No hydrocephalus or extra-axial fluid collection. Gray-white differentiation is preserved. Vascular: No hyperdense vessel. Skull: Normal. Negative for fracture or focal lesion. CT MAXILLOFACIAL FINDINGS Osseous: No fracture or mandibular dislocation. Poor dentition with multifocal dental caries and periapical lucencies. No additional destructive process. Orbits: Negative. No traumatic or inflammatory finding. Sinuses: Minimal mucosal thickening in the ethmoid air cells and right maxillary sinus. Otherwise clear paranasal sinuses. The mastoids are well aerated. Soft tissues: Negative. CT CERVICAL SPINE FINDINGS Alignment: No listhesis. Skull base and vertebrae: No acute fracture. No primary bone lesion or focal pathologic process. Soft tissues and spinal canal: No prevertebral fluid or swelling. No visible canal hematoma. Disc levels: Mild multilevel degenerative changes. No high-grade spinal canal stenosis. Upper chest: No focal pulmonary opacity or pleural effusion. Other: None. IMPRESSION: 1.  No acute intracranial process. 2.  No acute fracture or traumatic listhesis in the cervical spine. 3.  No evidence of facial bone fracture. Electronically Signed   By: Wiliam Ke M.D.   On: 03/08/2022 14:10    Pertinent labs & imaging results that were available during my care of the patient were reviewed by me and considered in my medical decision making (see MDM for details).  Medications Ordered in ED Medications - No data to display                                                                                                                                   Procedures Procedures  (including critical care time)  Medical Decision Making / ED Course   MDM:  46 year old male with history of polysubstance abuse presenting to the emergency department for suspected opioid overdose.  He was found unconscious by EMS, received Narcan with resolution of unconsciousness  Patient also had questionable  history of facial trauma, reportedly was seen to fall forward on his head and patient reports he is missing his 2 front teeth.  CT face as well as CT head and C-spine obtained without evidence of traumatic injury.  No lacerations or wounds.  Advise follow-up with outpatient dentistry for missing teeth  Patient observed for 3 hours without loss of consciousness or somnolence.  Patient ambulatory and requesting discharge.  Will prescribe Narcan.  Discussed resources with the patient. Will discharge patient to home. All questions answered. Patient comfortable with plan of discharge. Return precautions discussed with patient and specified on the after visit summary.       Additional history obtained: -Additional history obtained from EMS -External records from outside source obtained and reviewed including: Chart review including previous notes, labs, imaging, consultation notes   Lab Tests: -I ordered, reviewed, and interpreted labs.   The pertinent results include:   Labs Reviewed  BASIC METABOLIC PANEL - Abnormal;  Notable for the following components:      Result Value   Glucose, Bld 255 (*)    BUN 22 (*)    All other components within normal limits  CBC WITH DIFFERENTIAL/PLATELET - Abnormal; Notable for the following components:   WBC 21.7 (*)    Neutro Abs 18.4 (*)    Monocytes Absolute 2.0 (*)    Abs Immature Granulocytes 0.15 (*)    All other components within normal limits  PROTIME-INR     Imaging Studies ordered: I ordered imaging studies including CT head, face, neck I independently visualized and interpreted imaging. I agree with the radiologist interpretation   Medicines ordered and prescription drug management: Meds ordered this encounter  Medications   naloxone (NARCAN) nasal spray 4 mg/0.1 mL    Sig: If you must use illegal drugs or opiates please use them around other people, and give them this medicine, so they can give it through the nose if you develop any  overdose symptoms.    Dispense:  1 each    Refill:  1    -I have reviewed the patients home medicines and have made adjustments as needed Cardiac Monitoring: The patient was maintained on a cardiac monitor.  I personally viewed and interpreted the cardiac monitored which showed an underlying rhythm of: NSR  Social Determinants of Health:  Factors impacting patients care include: Drug use   Reevaluation: After the interventions noted above, I reevaluated the patient and found that they have :resolved  Co morbidities that complicate the patient evaluation  Past Medical History:  Diagnosis Date   ADD (attention deficit disorder)    Anxiety    Chronic hepatitis C (HCC)    Depression    Heroin abuse (HCC)    IV drug user       Dispostion: Discharge     Final Clinical Impression(s) / ED Diagnoses Final diagnoses:  Opioid overdose, accidental or unintentional, initial encounter [facility])     This chart was dictated using voice recognition software.  Despite best efforts to proofread,  errors can occur which can change the documentation meaning.    Lonell Grandchild, MD 03/08/22 1726

## 2022-03-08 NOTE — ED Triage Notes (Signed)
Pt BIB EMS, c/o fall r/t drug overdose. Noted with pinpoint pupils, diaphetic, and lethargic. 4 mg Narcan administered by EMS, rescue breathing for 12 minutes. Increased agitation, EMS placed 18 gauge ripped out, C-collar refused, EKG refused.   BP 120/68 HR 108 spO2 100% CBG 203

## 2022-04-08 ENCOUNTER — Other Ambulatory Visit: Payer: Self-pay

## 2022-04-08 ENCOUNTER — Emergency Department (HOSPITAL_COMMUNITY)
Admission: EM | Admit: 2022-04-08 | Discharge: 2022-04-09 | Disposition: A | Discharge status: Home / Self Care | Payer: Commercial Managed Care - HMO | Attending: Emergency Medicine | Admitting: Emergency Medicine

## 2022-04-08 DIAGNOSIS — T50901A Poisoning by unspecified drugs, medicaments and biological substances, accidental (unintentional), initial encounter: Secondary | ICD-10-CM

## 2022-04-08 DIAGNOSIS — T40411A Poisoning by fentanyl or fentanyl analogs, accidental (unintentional), initial encounter: Secondary | ICD-10-CM | POA: Diagnosis not present

## 2022-04-08 MED ORDER — NALOXONE HCL 4 MG/0.1ML NA LIQD
NASAL | 1 refills | Status: AC
  Filled 2022-04-08: qty 2, 1d supply, fill #0

## 2022-04-08 MED ORDER — NALOXONE HCL 0.4 MG/ML IJ SOLN: 0.4000 mg | Freq: Once | INTRAMUSCULAR | Status: DC

## 2022-04-08 NOTE — ED Provider Notes (Signed)
[facility] EMERGENCY DEPARTMENT Provider Note   CSN: [id number] Arrival date & time: 04/08/22  1816     History Chief Complaint  Patient presents with   Drug Overdose    HPI Kevin Parker is a 46 y.o. male presenting for overdose.  Patient has a history of similar.  He states that he bought fentanyl and wanted to get high in a hotel room.  He denies fevers or chills nausea vomiting syncope shortness of breath.  Was altered with EMS required Narcan at 6 PM spontaneous return of respirations after that.   Patient's recorded medical, surgical, social, medication list and allergies were reviewed in the Snapshot window as part of the initial history.   Review of Systems   Review of Systems  Constitutional:  Negative for chills and fever.  HENT:  Negative for ear pain and sore throat.   Eyes:  Negative for pain and visual disturbance.  Respiratory:  Negative for cough and shortness of breath.   Cardiovascular:  Negative for chest pain and palpitations.  Gastrointestinal:  Negative for abdominal pain and vomiting.  Genitourinary:  Negative for dysuria and hematuria.  Musculoskeletal:  Negative for arthralgias and back pain.  Skin:  Negative for color change and rash.  Neurological:  Negative for seizures and syncope.  All other systems reviewed and are negative.   Physical Exam Updated Vital Signs BP 123/80   Pulse 88   Temp 99.3 F (37.4 C) (Oral)   Resp 13   SpO2 100%  Physical Exam Vitals and nursing note reviewed.  Constitutional:      General: He is not in acute distress.    Appearance: He is well-developed.  HENT:     Head: Normocephalic and atraumatic.  Eyes:     Conjunctiva/sclera: Conjunctivae normal.  Cardiovascular:     Rate and Rhythm: Normal rate and regular rhythm.     Heart sounds: No murmur heard. Pulmonary:     Effort: Pulmonary effort is normal. No respiratory distress.     Breath sounds: Normal breath sounds.   Abdominal:     Palpations: Abdomen is soft.     Tenderness: There is no abdominal tenderness.  Musculoskeletal:        General: No swelling.     Cervical back: Neck supple.  Skin:    General: Skin is warm and dry.     Capillary Refill: Capillary refill takes less than 2 seconds.  Neurological:     Mental Status: He is alert.  Psychiatric:        Mood and Affect: Mood normal.      ED Course/ Medical Decision Making/ A&P Clinical Course as of 04/08/22 2351  Mon Apr 08, 2022  1923 Endorsed fentanyl OD. HX of similar. Accidental. Narcan at 1900. Reassess at 2130 [CC]    Clinical Course User Index [CC] Glyn Ade, MD    Procedures Procedures   Medications Ordered in ED Medications - No data to display  Medical Decision Making:    Kevin Parker is a 45 y.o. male who presented to the ED today with drug overdose detailed above.     Patient's presentation is complicated by their history of polysubstance use disorder.  Patient placed on continuous vitals and telemetry monitoring while in ED which was reviewed periodically.   Complete initial physical exam performed, notably the patient  was hemodynamically stable in no acute distress.      Reviewed and confirmed nursing documentation for past medical history, family  history, social history.    Initial Assessment:   Patient's history of present illness and physical exam findings are most consistent with narcotic overdose.  He is clinically stable in no acute distress.  EKG without focal pathology appreciated.  Patient will need time to metabolize his intoxicants.  Observed for 5-1/2 hours with gross symptomatic resolution the patient still not stable on his feet.  Patient likely will be stable for discharge in a.m. pending clinical reassessment.  Clinical Impression:  1. Accidental overdose, initial encounter      Data Unavailable   Final Clinical Impression(s) / ED Diagnoses Final diagnoses:   Accidental overdose, initial encounter    Rx / DC Orders ED Discharge Orders          Ordered    naloxone (NARCAN) nasal spray 4 mg/0.1 mL        04/08/22 2351              Glyn Ade, MD 04/08/22 2351

## 2022-04-08 NOTE — ED Triage Notes (Signed)
Pt BIB EMS due to OD> Pt was at hotel and took fentanyl. Pt is axox4 on arrival, ems had to bag pt on scene. VSS. EMS gave narcan.

## 2022-04-08 NOTE — Discharge Instructions (Addendum)
You should not use illicit drugs.  You never know what they may be laced with or output and abuse and misuse can result in your death.

## 2022-04-09 ENCOUNTER — Other Ambulatory Visit (HOSPITAL_COMMUNITY): Payer: Self-pay

## 2022-04-09 NOTE — ED Notes (Signed)
Tolerated ambulation trial

## 2022-04-15 ENCOUNTER — Other Ambulatory Visit: Payer: Self-pay

## 2022-04-15 ENCOUNTER — Encounter (HOSPITAL_COMMUNITY): Payer: Self-pay

## 2022-04-15 ENCOUNTER — Emergency Department (HOSPITAL_COMMUNITY)
Admission: EM | Admit: 2022-04-15 | Discharge: 2022-04-15 | Disposition: A | Discharge status: Home / Self Care | Payer: Commercial Managed Care - HMO | Attending: Emergency Medicine | Admitting: Emergency Medicine

## 2022-04-15 DIAGNOSIS — F17210 Nicotine dependence, cigarettes, uncomplicated: Secondary | ICD-10-CM | POA: Diagnosis not present

## 2022-04-15 DIAGNOSIS — M79604 Pain in right leg: Secondary | ICD-10-CM | POA: Diagnosis present

## 2022-04-15 DIAGNOSIS — L03115 Cellulitis of right lower limb: Secondary | ICD-10-CM | POA: Diagnosis not present

## 2022-04-15 MED ORDER — DEXTROSE 5 % IV SOLN
1500.0000 mg | Freq: Once | INTRAVENOUS | Status: AC
  Administered 2022-04-15: 1500 mg via INTRAVENOUS
  Filled 2022-04-15: qty 75

## 2022-04-15 NOTE — ED Triage Notes (Signed)
Patient reports that he was incarcerated x 120 days. Patient states he was wearing shoes that were too small. Patient has a blister to the right heel. Patient has redness and swelling to the right foot and posterior right leg x 5 days.

## 2022-04-15 NOTE — Discharge Instructions (Signed)
Your evaluation demonstrated cellulitis, and infection of the skin.  We gave you an antibiotic which is a single dose through an IV and lasts for around 2 weeks.  This should clear up your infection.  Please return if you develop any new or worsening symptoms, such as increased pain or swelling, difficulty walking, fevers, or any other new symptoms.

## 2022-04-15 NOTE — Progress Notes (Signed)
Pharmacy Note:  Dalbavancin for Acute Bacterial Skin and Skin Structure Infection (ABSSSI) Patients to [facility] Discharge Nayden Czajka Bagnell is an 46 y.o. male who presented to [facility] on 04/15/2022 with an Acute Bacterial Skin and Skin Structure Infection  Inclusion criteria - Indication []  Moderately large skin lesion (>=75 cm2 or larger - about the size of a baseball) [x]  Cellulitis  Inclusion Criteria - at least one SIRS criteria present []  WBC > 12,000 or < 4000 []  temp >100.9 or < 96.8 []  heart rate >90[]  respiratory rate >20  Patient was evaluated for the following exclusion criteria and no exclusions were found  Hardware involvement, Hypotension / shock, Elevated lactate (>2) without other explanation, gram-negative infection risk factors (bites, water exposure, infection after trauma, infection after skin graft, neutropenia, burns, severe immunocompromise), necrotizing fasciitis possible or confirmed, Known or suspected osteomyelitis or septic arthritis, endocarditis, diabetic foot infection, ischemic ulcers, post-operative wound infection, perirectal infections, need for drainage in the operating room, hand or facial infections, injection drug users with a fever, bacteremia, pregnancy or breastfeeding, allergy to related antibiotics like vancomycin, known liver disease (t.bili >2x ULN or AST/ALT 3x ULN)   , Pharm.D 04/15/2022 5:52 PM Clinical Pharmacist

## 2022-04-15 NOTE — ED Provider Notes (Signed)
[facility]-EMERGENCY DEPT Provider Note  CSN: [id number] Arrival date & time: 04/15/22 1710  Chief Complaint(s) Leg Pain  HPI Kevin Parker is a 46 y.o. male with history of hepatitis C, IV drug use presenting to the emergency department with right leg pain.  Patient reports redness, pain to the right leg from the upper ankle to the foot.  He reports no injection in the foot.  He was wearing a too small shoe recently.  Denies trauma.  Has been ambulatory.  Able to move ankle.  No fevers or chills.  Symptoms present for 5 days.   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 Authorizing Provider  ibuprofen (ADVIL) 200 MG tablet Take 200-400 mg by mouth every 6 (six) hours as needed for mild pain.    [provider]  naloxone [facility]) nasal spray 4 mg/0.1 mL If you must use illegal drugs/opiates use them around other people--give them this medicine so they can give it through the nose if you develop any overdose symptoms. 04/08/22   Glyn Ade, MD                                                                                                                                    Past Surgical History Past Surgical History:  Procedure Laterality Date   APPENDECTOMY     Family History Family History   Problem Relation Age of Onset   Healthy Mother    Healthy Father    ADD / ADHD Father    Diabetes Father    Hypertension Neg Hx     Social History Social History   Tobacco Use   Smoking status: Every Day    Packs/day: 1.00    Types: Cigarettes   Smokeless tobacco: Never  Vaping Use   Vaping Use: Never used  Substance Use Topics   Alcohol use: Not Currently    Comment: weekly   Drug use: Yes    Types: Marijuana, IV    Comment: heroin and Fentanyl   Allergies Patient has no known allergies.  Review of Systems Review of Systems  All other systems reviewed and are negative.   Physical Exam Vital Signs  I have reviewed the triage vital  signs BP (!) 154/93 (BP Location: Left Arm)   Pulse (!) 109   Temp 98.8 F (37.1 C) (Oral)   Resp 18   Ht 5' 11.5" (1.816 m)   Wt 83.9 kg   SpO2 100%   BMI 25.44 kg/m  Physical Exam Vitals and nursing note reviewed.  Constitutional:      General: He is not in acute distress.    Appearance: Normal appearance.  HENT:     Mouth/Throat:     Mouth: Mucous membranes are moist.  Eyes:     Conjunctiva/sclera: Conjunctivae normal.  Cardiovascular:     Rate and Rhythm: Normal rate and regular rhythm.  Pulmonary:     Effort: Pulmonary effort is normal. No respiratory distress.     Breath sounds: Normal breath sounds.  Abdominal:     General: Abdomen is flat.     Palpations: Abdomen is soft.     Tenderness: There is no abdominal tenderness.  Musculoskeletal:     Right lower leg: Edema present.     Left lower leg: No edema.     Comments: Mild soft tissue swelling to the dorsum of the foot extending up to the lower anterior shin, not circumferential, with overlying erythema, warmth, mild tenderness.  No crepitus.  No fluctuance or focal swelling to suggest abscess.  Patient able to wiggle toes without difficulty.  Range of motion of the ankle intact without pain.  Skin:    General: Skin is warm and dry.     Capillary Refill:  Capillary refill takes less than 2 seconds.  Neurological:     Mental Status: He is alert and oriented to person, place, and time. Mental status is at baseline.  Psychiatric:        Mood and Affect: Mood normal.        Behavior: Behavior normal.     ED Results and Treatments Labs (all labs ordered are listed, but only abnormal results are displayed) Labs Reviewed - No data to display                                                                                                                        Radiology No results found.  Pertinent labs & imaging results that were available during my care of the patient were reviewed by me and considered in my medical decision making (see MDM for details).  Medications Ordered in ED Medications  dalbavancin (DALVANCE) 1,500 mg in dextrose 5 % 500 mL IVPB (has no administration in time range)  Procedures Procedures  (including critical care time)  Medical Decision Making / ED Course   MDM:  46 year old male presenting to the emergency department with leg swelling.  Exam notable for localized erythema and swelling to the anterior foot and anterior lower shin.  No sign of septic joint, patient able to range ankle without significant pain.  Patient ambulatory.  Given area of swelling, mild tachycardia, will treat with dalbavancin one-time dose, discharge with outpatient follow-up. Will discharge patient to home. All questions answered. Patient comfortable with plan of discharge. Return precautions discussed with patient and specified on the after visit summary.       Additional history obtained: -External records from outside source obtained and reviewed including: Chart review including previous notes, labs, imaging, consultation notes   Lab Tests: -I ordered, reviewed, and interpreted labs.   The  pertinent results include:   Labs Reviewed - No data to display      Medicines ordered and prescription drug management: Meds ordered this encounter  Medications   dalbavancin (DALVANCE) 1,500 mg in dextrose 5 % 500 mL IVPB    Order Specific Question:   Indication:    Answer:   Cellulitis    -I have reviewed the patients home medicines and have made adjustments as needed   Social Determinants of Health:  Factors impacting patients care include: polysubstance abuse   Reevaluation: After the interventions noted above, I reevaluated the patient and found that they have improved  Co morbidities that complicate the patient evaluation  Past Medical History:  Diagnosis Date   ADD (attention deficit disorder)    Anxiety    Chronic hepatitis C (HCC)    Depression    Heroin abuse (HCC)    IV drug user       Dispostion: Discharge    Final Clinical Impression(s) / ED Diagnoses Final diagnoses:  Cellulitis of right foot     This chart was dictated using voice recognition software.  Despite best efforts to proofread,  errors can occur which can change the documentation meaning.    Lonell Grandchild, MD 04/15/22 1819

## 2022-04-16 ENCOUNTER — Other Ambulatory Visit (HOSPITAL_COMMUNITY): Payer: Self-pay

## 2022-04-20 ENCOUNTER — Other Ambulatory Visit (HOSPITAL_COMMUNITY): Payer: Self-pay

## 2023-03-01 ENCOUNTER — Encounter (HOSPITAL_COMMUNITY): Payer: Self-pay

## 2023-03-01 ENCOUNTER — Observation Stay (HOSPITAL_COMMUNITY)
Admission: EM | Admit: 2023-03-01 | Discharge: 2023-03-02 | Disposition: A | Discharge status: Home / Self Care | Payer: 59 | Attending: Internal Medicine | Admitting: Internal Medicine

## 2023-03-01 ENCOUNTER — Other Ambulatory Visit: Payer: Self-pay

## 2023-03-01 ENCOUNTER — Emergency Department (HOSPITAL_COMMUNITY): Payer: 59

## 2023-03-01 DIAGNOSIS — T50991A Poisoning by other drugs, medicaments and biological substances, accidental (unintentional), initial encounter: Principal | ICD-10-CM | POA: Insufficient documentation

## 2023-03-01 DIAGNOSIS — R9389 Abnormal findings on diagnostic imaging of other specified body structures: Secondary | ICD-10-CM | POA: Diagnosis not present

## 2023-03-01 DIAGNOSIS — F17210 Nicotine dependence, cigarettes, uncomplicated: Secondary | ICD-10-CM | POA: Diagnosis not present

## 2023-03-01 DIAGNOSIS — T50901A Poisoning by unspecified drugs, medicaments and biological substances, accidental (unintentional), initial encounter: Secondary | ICD-10-CM | POA: Diagnosis not present

## 2023-03-01 DIAGNOSIS — T6594XA Toxic effect of unspecified substance, undetermined, initial encounter: Principal | ICD-10-CM | POA: Diagnosis present

## 2023-03-01 DIAGNOSIS — R000 Tachycardia, unspecified: Secondary | ICD-10-CM | POA: Diagnosis not present

## 2023-03-01 LAB — CBC WITH DIFFERENTIAL/PLATELET
Abs Immature Granulocytes: 0.04 10*3/uL (ref 0.00–0.07)
Basophils Absolute: 0 10*3/uL (ref 0.0–0.1)
Basophils Relative: 0 %
Eosinophils Absolute: 0 10*3/uL (ref 0.0–0.5)
Eosinophils Relative: 1 %
HCT: 38.1 % — ABNORMAL LOW (ref 39.0–52.0)
Hemoglobin: 12.1 g/dL — ABNORMAL LOW (ref 13.0–17.0)
Immature Granulocytes: 1 %
Lymphocytes Relative: 21 %
Lymphs Abs: 1.8 10*3/uL (ref 0.7–4.0)
MCH: 27.3 pg (ref 26.0–34.0)
MCHC: 31.8 g/dL (ref 30.0–36.0)
MCV: 85.8 fL (ref 80.0–100.0)
Monocytes Absolute: 0.7 10*3/uL (ref 0.1–1.0)
Monocytes Relative: 9 %
Neutro Abs: 5.9 10*3/uL (ref 1.7–7.7)
Neutrophils Relative %: 68 %
Platelets: 397 10*3/uL (ref 150–400)
RBC: 4.44 MIL/uL (ref 4.22–5.81)
RDW: 14.2 % (ref 11.5–15.5)
WBC: 8.6 10*3/uL (ref 4.0–10.5)
nRBC: 0 % (ref 0.0–0.2)

## 2023-03-01 LAB — COMPREHENSIVE METABOLIC PANEL
ALT: 84 U/L — ABNORMAL HIGH (ref 0–44)
AST: 73 U/L — ABNORMAL HIGH (ref 15–41)
Albumin: 3.5 g/dL (ref 3.5–5.0)
Alkaline Phosphatase: 98 U/L (ref 38–126)
Anion gap: 9 (ref 5–15)
BUN: 17 mg/dL (ref 6–20)
CO2: 29 mmol/L (ref 22–32)
Calcium: 9.4 mg/dL (ref 8.9–10.3)
Chloride: 100 mmol/L (ref 98–111)
Creatinine, Ser: 0.81 mg/dL (ref 0.61–1.24)
GFR, Estimated: >60 mL/min (ref >60)
Glucose, Bld: 101 mg/dL — ABNORMAL HIGH (ref 70–99)
Potassium: 4.4 mmol/L (ref 3.5–5.1)
Sodium: 138 mmol/L (ref 135–145)
Total Bilirubin: 0.5 mg/dL (ref 0.3–1.2)
Total Protein: 8.8 g/dL — ABNORMAL HIGH (ref 6.5–8.1)

## 2023-03-01 NOTE — ED Provider Notes (Signed)
[place] EMERGENCY DEPARTMENT AT [facility] Provider Note   CSN: [id number] Arrival date & time: 03/01/23  2155     History {Add pertinent medical, surgical, social history, OB history to HPI:1} Chief Complaint  Patient presents with   Ingestion    Kevin Parker is a 47 y.o. male.  47 year old male with a history of polysubstance abuse presents to the emergency department for evaluation of ingestion.  He states that he swallowed wax paper with Xanax, methamphetamines, and a rock of fentanyl so that a (girl)friend wouldn't be arrested by police.  Reports the drugs were NOT in a plastic bag.  Cannot specifically tell me when this ingestion occurred, but reports it was when the son was going down.  He also smoked meth earlier this morning.  He denies alcohol use.  He is not experiencing any chest pain, shortness of breath, abdominal pain, nausea, vomiting.  The history is provided by the patient. No language interpreter was used.  Ingestion       Home Medications Prior to Admission medications   Medication Sig Start Date End Date Taking? Authorizing Provider  ibuprofen (ADVIL) 200 MG tablet Take 200-400 mg by mouth every 6 (six) hours as needed for mild pain.    [provider]  naloxone [facility]) nasal spray 4 mg/0.1 mL If you must use illegal drugs/opiates use them around other people--give them this medicine so they can give it through the nose if you develop any overdose symptoms. 04/08/22   Glyn Ade, MD      Allergies    Patient has no known allergies.    Review of Systems   Review of Systems Ten systems reviewed and are negative for acute change, except as noted in the HPI.    Physical Exam Updated Vital Signs BP (!) 153/98   Pulse (!) 109   Temp 98.6 F (37 C) (Oral)   Resp (!) 21   SpO2 100%   Physical Exam Vitals and nursing note reviewed.  Constitutional:      General: He is not in acute distress.    Appearance: He  is well-developed. He is not diaphoretic.     Comments: Wrists handcuffed. Nontoxic.   HENT:     Head: Normocephalic and atraumatic.  Eyes:     General: No scleral icterus.    Extraocular Movements: Extraocular movements intact.     Conjunctiva/sclera: Conjunctivae normal.     Comments: Pupils dilated to 5mm  Cardiovascular:     Rate and Rhythm: Normal rate and regular rhythm.     Pulses: Normal pulses.     Comments: HR in upper 90's on my assessment. Pulmonary:     Effort: Pulmonary effort is normal. No respiratory distress.     Comments: Respirations even and unlabored Musculoskeletal:        General: Normal range of motion.     Cervical back: Normal range of motion.  Skin:    General: Skin is warm and dry.     Coloration: Skin is not pale.     Findings: No erythema or rash.  Neurological:     Mental Status: He is alert and oriented to person, place, and time.     Coordination: Coordination normal.     Comments: Alert and answering questions appropriately.  Moving all extremities spontaneously.  Psychiatric:        Behavior: Behavior normal.     ED Results / Procedures / Treatments   Labs (all labs ordered are listed,  but only abnormal results are displayed) Labs Reviewed  CBC WITH DIFFERENTIAL/PLATELET  COMPREHENSIVE METABOLIC PANEL  URINALYSIS, ROUTINE W REFLEX MICROSCOPIC  RAPID URINE DRUG SCREEN, HOSP PERFORMED    EKG None  Radiology No results found.  Procedures Procedures  {Document cardiac monitor, telemetry assessment procedure when appropriate:1}  Medications Ordered in ED Medications - No data to display  ED Course/ Medical Decision Making/ A&P Clinical Course as of 03/01/23 2318  Sat Mar 01, 2023  2315 Poison control contacted and recommends observation for 24 hours. For medical clearance, would require normal set of vitals, not requiring naloxone, antipsychotics, or benzodiazepines for at least 8 hours. Charcoal or 1-2 liters of Golytely if  intubated only. [KH]    Clinical Course User Index [KH] Antony Madura, PA-C   {   Click here for ABCD2, HEART and other calculatorsREFRESH Note before signing :1}                          Medical Decision Making Amount and/or Complexity of Data Reviewed Labs: ordered. Radiology: ordered.   ***  {Document critical care time when appropriate:1} {Document review of labs and clinical decision tools ie heart score, Chads2Vasc2 etc:1}  {Document your independent review of radiology images, and any outside records:1} {Document your discussion with family members, caretakers, and with consultants:1} {Document social determinants of health affecting pt's care:1} {Document your decision making why or why not admission, treatments were needed:1} Final Clinical Impression(s) / ED Diagnoses Final diagnoses:  None    Rx / DC Orders ED Discharge Orders     None

## 2023-03-01 NOTE — ED Triage Notes (Signed)
Pt. Arrives POV for ingestion of fentanyl. Pt. States that he does not know how much he had, but he swallowed it so his friend would so his friend would not get charged. Pt. States that he smoked meth this morning. But denies ETOH. Pt. Pupils are dilated

## 2023-03-02 DIAGNOSIS — T6594XA Toxic effect of unspecified substance, undetermined, initial encounter: Principal | ICD-10-CM | POA: Diagnosis present

## 2023-03-02 LAB — CULTURE, BLOOD (ROUTINE X 2)
Culture: NO GROWTH
Culture: NO GROWTH
Special Requests: ADEQUATE
Special Requests: ADEQUATE

## 2023-03-02 LAB — URINALYSIS, ROUTINE W REFLEX MICROSCOPIC
Bilirubin Urine: NEGATIVE
Glucose, UA: NEGATIVE mg/dL
Hgb urine dipstick: NEGATIVE
Ketones, ur: NEGATIVE mg/dL
Leukocytes,Ua: NEGATIVE
Nitrite: NEGATIVE
Protein, ur: NEGATIVE mg/dL
Specific Gravity, Urine: 1.021 (ref 1.005–1.030)
pH: 6 (ref 5.0–8.0)

## 2023-03-02 LAB — COMPREHENSIVE METABOLIC PANEL
ALT: 78 U/L — ABNORMAL HIGH (ref 0–44)
AST: 67 U/L — ABNORMAL HIGH (ref 15–41)
Albumin: 3.2 g/dL — ABNORMAL LOW (ref 3.5–5.0)
Alkaline Phosphatase: 89 U/L (ref 38–126)
Anion gap: 8 (ref 5–15)
BUN: 18 mg/dL (ref 6–20)
CO2: 28 mmol/L (ref 22–32)
Calcium: 9.1 mg/dL (ref 8.9–10.3)
Chloride: 100 mmol/L (ref 98–111)
Creatinine, Ser: 0.69 mg/dL (ref 0.61–1.24)
GFR, Estimated: >60 mL/min (ref >60)
Glucose, Bld: 95 mg/dL (ref 70–99)
Potassium: 4.1 mmol/L (ref 3.5–5.1)
Sodium: 136 mmol/L (ref 135–145)
Total Bilirubin: 0.4 mg/dL (ref 0.3–1.2)
Total Protein: 8 g/dL (ref 6.5–8.1)

## 2023-03-02 LAB — RAPID URINE DRUG SCREEN, HOSP PERFORMED
Amphetamines: POSITIVE — AB
Barbiturates: NOT DETECTED
Benzodiazepines: POSITIVE — AB
Cocaine: POSITIVE — AB
Opiates: NOT DETECTED
Tetrahydrocannabinol: POSITIVE — AB

## 2023-03-02 LAB — CBC
HCT: 38.5 % — ABNORMAL LOW (ref 39.0–52.0)
Hemoglobin: 12.1 g/dL — ABNORMAL LOW (ref 13.0–17.0)
MCH: 26.9 pg (ref 26.0–34.0)
MCHC: 31.4 g/dL (ref 30.0–36.0)
MCV: 85.6 fL (ref 80.0–100.0)
Platelets: 374 10*3/uL (ref 150–400)
RBC: 4.5 MIL/uL (ref 4.22–5.81)
RDW: 14.3 % (ref 11.5–15.5)
WBC: 7.3 10*3/uL (ref 4.0–10.5)
nRBC: 0 % (ref 0.0–0.2)

## 2023-03-02 LAB — MAGNESIUM: Magnesium: 2.2 mg/dL (ref 1.7–2.4)

## 2023-03-02 LAB — PHOSPHORUS: Phosphorus: 4 mg/dL (ref 2.5–4.6)

## 2023-03-02 MED ORDER — PROCHLORPERAZINE EDISYLATE 10 MG/2ML IJ SOLN: 5.0000 mg | Freq: Four times a day (QID) | INTRAMUSCULAR | Status: DC | PRN

## 2023-03-02 MED ORDER — CHARCOAL ACTIVATED PO LIQD
25.0000 g | Freq: Once | ORAL | Status: AC
  Administered 2023-03-02: 25 g via ORAL
  Filled 2023-03-02: qty 240

## 2023-03-02 MED ORDER — ENOXAPARIN SODIUM 40 MG/0.4ML IJ SOSY
40.0000 mg | PREFILLED_SYRINGE | INTRAMUSCULAR | Status: DC
  Filled 2023-03-02: qty 0.4

## 2023-03-02 MED ORDER — SODIUM CHLORIDE 0.9 % IV SOLN: INTRAVENOUS | Status: DC

## 2023-03-02 MED ORDER — ACETAMINOPHEN 325 MG PO TABS: 650.0000 mg | ORAL_TABLET | Freq: Four times a day (QID) | ORAL | Status: DC | PRN

## 2023-03-02 MED ORDER — MELATONIN 5 MG PO TABS: 5.0000 mg | ORAL_TABLET | Freq: Every evening | ORAL | Status: DC | PRN

## 2023-03-02 MED ORDER — AMLODIPINE BESYLATE 5 MG PO TABS
5.0000 mg | ORAL_TABLET | Freq: Every day | ORAL | Status: DC
  Administered 2023-03-02: 5 mg via ORAL
  Filled 2023-03-02: qty 1

## 2023-03-02 MED ORDER — POLYETHYLENE GLYCOL 3350 17 G PO PACK: 17.0000 g | PACK | Freq: Every day | ORAL | Status: DC | PRN

## 2023-03-02 MED ORDER — NICOTINE 14 MG/24HR TD PT24: 14.0000 mg | MEDICATED_PATCH | Freq: Every day | TRANSDERMAL | Status: DC

## 2023-03-02 NOTE — ED Notes (Signed)
1 pocket knife turned in to security to be stored in safe/secure location

## 2023-03-02 NOTE — Progress Notes (Signed)
Called poison control and discussed the case with RN Reuel Boom from poison control.  Since the patient is alert and oriented and not lethargic with no overt risk for aspiration, recommends either Golytely 2 L x 1 or charcoal 1g/kg x 1 with max dose of 75 g x 1.  Anticipated delayed effect of the ingested drugs.    No charge note.

## 2023-03-02 NOTE — ED Notes (Signed)
Pt. Given sandwich and soda per Hospitalist.

## 2023-03-02 NOTE — H&P (Signed)
History and Physical  Riku Yohey ZOX:[id number] DOB: 29-Jun-1976 DOA: 03/01/2023  Referring physician: Antony Madura, PA-EDP   PCP: Claiborne Rigg, NP  Outpatient Specialists: None Patient coming from: Home, accompanied by [facility] Police  Chief Complaint: Ingestion of substances  HPI: Shaheem Folley is a 47 y.o. male with medical history significant for polysubstance abuse, including IV drug abuse, who presents to [facility] ED for evaluation of ingestion of multiple substances.  Endorses swallowing wax paper with multiple substances that include Xanax, methamphetamine, and a rock of fentanyl so that a friend would not be arrested by police.  Admits to smoking methamphetamine and injecting IV drugs into his right upper arm earlier this morning.  No reported cardiothoracic or GI symptoms.  No subjective fevers or chills.  In the ED, BP is elevated, tachycardic.  EDP discussed the case with poison control, recommended observation for 24 hours and to release home after medical clearance.  TRH, hospitalist service, was asked to admit.  The patient was admitted to telemetry unit as observation status.  ED Course: Temperature 98.6.  BP 153/98, pulse 109, respiratory rate 21, O2 saturation 100% on room air.  Lab studies essentially unremarkable except for mildly elevated LFTs AST 73, ALT 84.  Review of Systems: Review of systems as noted in the HPI. All other systems reviewed and are negative.   Past Medical History:  Diagnosis Date   ADD (attention deficit disorder)    Anxiety    Chronic hepatitis C (HCC)    Depression    Heroin abuse (HCC)    IV drug user    Past Surgical History:  Procedure Laterality Date   APPENDECTOMY      Social History:  reports that he has been smoking cigarettes. He has never used smokeless tobacco. He reports that he does not currently use alcohol. He reports current drug use. Drugs: Marijuana and IV.   No Known Allergies  Family  History  Problem Relation Age of Onset   Healthy Mother    Healthy Father    ADD / ADHD Father    Diabetes Father    Hypertension Neg Hx       Prior to Admission medications   Medication Sig Start Date End Date Taking? Authorizing Provider  ibuprofen (ADVIL) 200 MG tablet Take 200-400 mg by mouth every 6 (six) hours as needed for mild pain.    [provider]  naloxone [facility]) nasal spray 4 mg/0.1 mL If you must use illegal drugs/opiates use them around other people--give them this medicine so they can give it through the nose if you develop any overdose symptoms. 04/08/22   Glyn Ade, MD    Physical Exam: BP (!) 153/98   Pulse (!) 109   Temp 98.6 F (37 C) (Oral)   Resp (!) 21   SpO2 100%   General: 47 y.o. year-old male well developed well nourished in no acute distress.  Alert and oriented x3. Cardiovascular: Regular rate and rhythm with no rubs or gallops.  No thyromegaly or JVD noted.  No lower extremity edema. 2/4 pulses in all 4 extremities. Respiratory: Clear to auscultation with no wheezes or rales. Good inspiratory effort. Abdomen: Soft nontender nondistended with normal bowel sounds x4 quadrants. Muskuloskeletal: No cyanosis, clubbing or edema noted bilaterally Neuro: CN II-XII intact, strength, sensation, reflexes Skin: No ulcerative lesions noted or rashes.  Needle tracks in right upper extremity. Psychiatry: Judgement and insight appear normal. Mood is appropriate for condition and setting  Labs on Admission:  Basic Metabolic Panel: Recent Labs  Lab 03/01/23 2237  NA 138  K 4.4  CL 100  CO2 29  GLUCOSE 101*  BUN 17  CREATININE 0.81  CALCIUM 9.4   Liver Function Tests: Recent Labs  Lab 03/01/23 2237  AST 73*  ALT 84*  ALKPHOS 98  BILITOT 0.5  PROT 8.8*  ALBUMIN 3.5   No results for input(s): "LIPASE", "AMYLASE" in the last 168 hours. No results for input(s): "AMMONIA" in the last 168 hours. CBC: Recent Labs  Lab  03/01/23 2237  WBC 8.6  NEUTROABS 5.9  HGB 12.1*  HCT 38.1*  MCV 85.8  PLT 397   Cardiac Enzymes: No results for input(s): "CKTOTAL", "CKMB", "CKMBINDEX", "TROPONINI" in the last 168 hours.  BNP (last 3 results) No results for input(s): "BNP" in the last 8760 hours.  ProBNP (last 3 results) No results for input(s): "PROBNP" in the last 8760 hours.  CBG: No results for input(s): "GLUCAP" in the last 168 hours.  Radiological Exams on Admission: DG Abd 1 View  Result Date: 03/01/2023 CLINICAL DATA:  Unknown fentanyl ingestion. EXAM: ABDOMEN - 1 VIEW COMPARISON:  None Available. FINDINGS: The bowel gas pattern is nonobstructive with mild-to-moderate fecal stasis. There are clustered surgical clips in the right lower quadrant, single surgical clip in the left hemipelvis. There is no supine evidence of free air. Lung bases are clear with mild elevation of the right hemidiaphragm. There is no visible foreign body other than the clips. No radio-opaque calculi or other significant radiographic abnormality are seen. IMPRESSION: 1. No acute radiographic findings. 2. Mild to moderate fecal stasis. 3. Surgical clips in the right lower quadrant and left hemipelvis. Electronically Signed   By: Almira Bar M.D.   On: 03/01/2023 23:22    EKG: I independently viewed the EKG done and my findings are as followed: Sinus tachycardia rate of 115.  Nonspecific ST-T changes.  QTc 446  Assessment/Plan Present on Admission:  Ingestion of substance, undetermined intent, initial encounter  Principal Problem:   Ingestion of substance, undetermined intent, initial encounter  Ingestion of polysubstances Reportedly ingested wax paper with Xanax, methamphetamine, and a rock of fentanyl so that a friend would not be arrested by police. EDP discussed the case with poison control.  Recommended 24 hours observation. Admitted to telemetry unit. Monitor overnight Gentle IV fluid hydration NS at 50 cc/h x 1  day.  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  Elevated liver chemistries AST and ALT mildly elevated. Monitor LFTs. Avoid hepatotoxic agents.  Polysubstance abuse TOC consulted to provide resources for polysubstance abuse cessation   Time: 75 minutes.    DVT prophylaxis: Subcu Lovenox daily  Code Status: Full code  Family Communication: None at bedside.  Disposition Plan: Admitted to telemetry unit  Consults called: Poison control, TOC.  Admission status: Observation status.   Status is: Observation    Darlin Drop MD [facility] Pager [phone]  If 7PM-7AM, please contact night-coverage [email or web address] Password [facility]  03/02/2023, 12:21 AM

## 2023-03-02 NOTE — ED Notes (Signed)
Poison control consulted. Danielle RN recommends activated charcoal. MD aware.
